# Patient Record
Sex: Female | Born: 1960 | Race: White | Hispanic: No | Marital: Married | Smoking: Current every day smoker
Health system: Southern US, Community
[De-identification: ages and names within clinical notes are randomized; demographics above are authoritative.]

## PROBLEM LIST (undated history)

## (undated) DIAGNOSIS — E785 Hyperlipidemia, unspecified: Secondary | ICD-10-CM

## (undated) DIAGNOSIS — Z789 Other specified health status: Secondary | ICD-10-CM

## (undated) DIAGNOSIS — J449 Chronic obstructive pulmonary disease, unspecified: Secondary | ICD-10-CM

## (undated) DIAGNOSIS — I2510 Atherosclerotic heart disease of native coronary artery without angina pectoris: Secondary | ICD-10-CM

## (undated) DIAGNOSIS — R0600 Dyspnea, unspecified: Secondary | ICD-10-CM

## (undated) DIAGNOSIS — R911 Solitary pulmonary nodule: Secondary | ICD-10-CM

## (undated) DIAGNOSIS — E782 Mixed hyperlipidemia: Secondary | ICD-10-CM

## (undated) DIAGNOSIS — J302 Other seasonal allergic rhinitis: Secondary | ICD-10-CM

## (undated) DIAGNOSIS — R0609 Other forms of dyspnea: Secondary | ICD-10-CM

## (undated) DIAGNOSIS — Z720 Tobacco use: Secondary | ICD-10-CM

## (undated) HISTORY — PX: ABDOMINAL HYSTERECTOMY: SHX81

## (undated) HISTORY — DX: Other specified health status: Z78.9

## (undated) HISTORY — PX: MULTIPLE TOOTH EXTRACTIONS: SHX2053

## (undated) HISTORY — DX: Tobacco use: Z72.0

## (undated) HISTORY — PX: HERNIA REPAIR: SHX51

## (undated) HISTORY — DX: Mixed hyperlipidemia: E78.2

## (undated) HISTORY — DX: Other forms of dyspnea: R06.09

## (undated) HISTORY — DX: Solitary pulmonary nodule: R91.1

## (undated) HISTORY — DX: Atherosclerotic heart disease of native coronary artery without angina pectoris: I25.10

---

## 1998-03-01 ENCOUNTER — Other Ambulatory Visit: Admission: RE | Admit: 1998-03-01 | Discharge: 1998-03-01 | Payer: Self-pay | Admitting: Obstetrics and Gynecology

## 1999-06-05 ENCOUNTER — Other Ambulatory Visit: Admission: RE | Admit: 1999-06-05 | Discharge: 1999-06-05 | Payer: Self-pay | Admitting: Obstetrics and Gynecology

## 2000-01-07 ENCOUNTER — Encounter: Payer: Self-pay | Admitting: Family Medicine

## 2000-01-07 ENCOUNTER — Ambulatory Visit (HOSPITAL_COMMUNITY): Admission: RE | Admit: 2000-01-07 | Discharge: 2000-01-07 | Payer: Self-pay | Admitting: Family Medicine

## 2001-03-06 ENCOUNTER — Ambulatory Visit (HOSPITAL_COMMUNITY): Admission: RE | Admit: 2001-03-06 | Discharge: 2001-03-06 | Payer: Self-pay | Admitting: Family Medicine

## 2001-03-06 ENCOUNTER — Encounter: Payer: Self-pay | Admitting: Family Medicine

## 2016-02-14 ENCOUNTER — Ambulatory Visit (INDEPENDENT_AMBULATORY_CARE_PROVIDER_SITE_OTHER): Payer: Managed Care, Other (non HMO) | Admitting: Osteopathic Medicine

## 2016-02-14 ENCOUNTER — Encounter: Payer: Self-pay | Admitting: Osteopathic Medicine

## 2016-02-14 VITALS — BP 130/67 | HR 82 | Resp 17 | Ht 63.25 in | Wt 140.0 lb

## 2016-02-14 DIAGNOSIS — K589 Irritable bowel syndrome without diarrhea: Secondary | ICD-10-CM | POA: Insufficient documentation

## 2016-02-14 DIAGNOSIS — K582 Mixed irritable bowel syndrome: Secondary | ICD-10-CM | POA: Diagnosis not present

## 2016-02-14 DIAGNOSIS — Z0000 Encounter for general adult medical examination without abnormal findings: Secondary | ICD-10-CM

## 2016-02-14 DIAGNOSIS — J302 Other seasonal allergic rhinitis: Secondary | ICD-10-CM

## 2016-02-14 DIAGNOSIS — N952 Postmenopausal atrophic vaginitis: Secondary | ICD-10-CM

## 2016-02-14 DIAGNOSIS — Z9289 Personal history of other medical treatment: Secondary | ICD-10-CM

## 2016-02-14 DIAGNOSIS — Z23 Encounter for immunization: Secondary | ICD-10-CM | POA: Diagnosis not present

## 2016-02-14 HISTORY — DX: Irritable bowel syndrome, unspecified: K58.9

## 2016-02-14 HISTORY — DX: Postmenopausal atrophic vaginitis: N95.2

## 2016-02-14 HISTORY — DX: Personal history of other medical treatment: Z92.89

## 2016-02-14 LAB — CBC WITH DIFFERENTIAL/PLATELET
Basophils Absolute: 0 cells/uL (ref 0–200)
Basophils Relative: 0 %
Eosinophils Absolute: 192 cells/uL (ref 15–500)
Eosinophils Relative: 3 %
HCT: 46.6 % — ABNORMAL HIGH (ref 35.0–45.0)
Hemoglobin: 15.2 g/dL (ref 11.7–15.5)
Lymphocytes Relative: 46 %
Lymphs Abs: 2944 cells/uL (ref 850–3900)
MCH: 30.1 pg (ref 27.0–33.0)
MCHC: 32.6 g/dL (ref 32.0–36.0)
MCV: 92.3 fL (ref 80.0–100.0)
MPV: 9.2 fL (ref 7.5–12.5)
Monocytes Absolute: 448 cells/uL (ref 200–950)
Monocytes Relative: 7 %
Neutro Abs: 2816 cells/uL (ref 1500–7800)
Neutrophils Relative %: 44 %
Platelets: 271 10*3/uL (ref 140–400)
RBC: 5.05 MIL/uL (ref 3.80–5.10)
RDW: 13.9 % (ref 11.0–15.0)
WBC: 6.4 10*3/uL (ref 3.8–10.8)

## 2016-02-14 LAB — HEPATITIS C ANTIBODY: HCV Ab: NEGATIVE

## 2016-02-14 LAB — TSH: TSH: 1.93 mIU/L

## 2016-02-14 MED ORDER — ESTRADIOL 0.1 MG/GM VA CREA: 1.0000 | TOPICAL_CREAM | Freq: Every day | VAGINAL | 12 refills | Status: DC

## 2016-02-14 NOTE — Progress Notes (Signed)
HPI: Sierra RomeHelga Snyder is a 56 y.o. female  who presents to [facility] [place] today, 02/14/16,  for chief complaint of:  Chief Complaint  Patient presents with  . Establish Care    Patient requests general physical exam  Hx chest discomfort: no exertional chest pain. Stress test was done around 2013 and was normal. Records requested. She states she gets similar discomfort every few months lasts a few seconds - minutes, no exertional chest pain  Seasonal allergies/Cough: coughing once in awhile. Dx with PNA at UC in [place] in the past but then told no pneumonia.   IBS: D+/C- alternating, occasional nausea in the mornings, nothing seems to help it.   Decreased libido: History of vaginal hysterectomy for fibroids, diminished libido over the past few years, no significant pain with sex but reports vaginal dryness, no bleeding. Went through menopause-type symptoms with hot flashes and make changes a few years ago but no longer having the symptoms.    Past medical, surgical, social and family history reviewed: There are no active problems to display for this patient.  No past surgical history on file. Social History  Substance Use Topics  . Smoking status: Not on file  . Smokeless tobacco: Not on file  . Alcohol use Not on file   No family history on file.   Current medication list and allergy/intolerance information reviewed:   Current Outpatient Prescriptions  Medication Sig Dispense Refill  . cetirizine (ZYRTEC) 10 MG tablet Take 10 mg by mouth daily as needed for allergies.    Marland Kitchen. gabapentin (NEURONTIN) 100 MG capsule Take 100 mg by mouth 3 (three) times daily as needed.     No current facility-administered medications for this visit.    Allergies  Allergen Reactions  . Allegra [Fexofenadine] Nausea Only  . Aspirin Other (See Comments)    Stomach pain  . Mucinex [Guaifenesin Er] Diarrhea  . Claritin [Loratadine] Rash  . Tamiflu [Oseltamivir  Phosphate] Rash      Review of Systems:  Constitutional:  No  fever, no chills, No recent illness, No unintentional weight changes. No significant fatigue.   HEENT: No  headache, no vision change, no hearing change, No sore throat, +sinus pressure  Cardiac: +prior chest pain, No  pressure, No palpitations, No  Orthopnea  Respiratory:  No  shortness of breath. No  Cough  Gastrointestinal: No  abdominal +, No  nausea, No  vomiting,  No  blood in stool, +diarrhea, +constipation   Musculoskeletal: No new myalgia/arthralgia  Genitourinary: No  incontinence, No  abnormal genital bleeding, No abnormal genital discharge  Skin: No  Rash, No other wounds/concerning lesions  Hem/Onc: No  easy bruising/bleeding, No  abnormal lymph node  Endocrine: No cold intolerance,  No heat intolerance. No polyuria/polydipsia/polyphagia   Neurologic: No  weakness, No  dizziness  Psychiatric: No  concerns with depression, No  concerns with anxiety, No sleep problems, No mood problems  Exam:  BP 130/67   Pulse 82   Resp 17   Ht 5' 3.25" (1.607 m)   Wt 140 lb (63.5 kg)   SpO2 98%   BMI 24.60 kg/m   Constitutional: VS see above. General Appearance: alert, well-developed, well-nourished, NAD  Eyes: Normal lids and conjunctive, non-icteric sclera  Ears, Nose, Mouth, Throat: MMM, Normal external inspection ears/nares/mouth/lips/gums. TM normal bilaterally. Pharynx/tonsils no erythema, no exudate. Nasal mucosa normal.   Neck: No masses, trachea midline. No thyroid enlargement. No tenderness/mass appreciated. No lymphadenopathy  Respiratory: Normal respiratory effort.  no wheeze, no rhonchi, no rales  Cardiovascular: S1/S2 normal, no murmur, no rub/gallop auscultated. RRR. No lower extremity edema.  Gastrointestinal: Nontender, no masses. No hepatomegaly, no splenomegaly. No hernia appreciated. Bowel sounds normal. Rectal exam deferred.   Musculoskeletal: Gait normal. No clubbing/cyanosis of  digits.   Neurological: Normal balance/coordination. No tremor. No cranial nerve deficit on limited exam. Motor and sensation intact and symmetric. Cerebellar reflexes intact.   Skin: warm, dry, intact. No rash/ulcer. No concerning nevi or subq nodules on limited exam.    Psychiatric: Normal judgment/insight. Normal mood and affect. Oriented x3.     ASSESSMENT/PLAN:   Annual physical exam - Plan: CBC with Differential/Platelet, Hepatitis C antibody, HIV antibody, Lipid panel, COMPLETE METABOLIC PANEL WITH GFR, TSH, VITAMIN D 25 Hydroxy (Vit-D Deficiency, Fractures), MM DIGITAL SCREENING BILATERAL, Tdap vaccine greater than or equal to 7yo IM  Irritable bowel syndrome with both constipation and diarrhea  Chronic seasonal allergic rhinitis due to other allergen  Postmenopausal atrophic vaginitis - trial topical estrogen  History of cardiovascular stress test - normal per patient. precautions reviewed   FEMALE PREVENTIVE CARE Updated 02/14/16   ANNUAL SCREENING/COUNSELING  Diet/Exercise - HEALTHY HABITS DISCUSSED TO DECREASE CV RISK History  Smoking Status  . Current Every Day Smoker  . Packs/day: 1.00  . Years: 25.00  . Types: Cigarettes  Smokeless Tobacco  . Never Used   History  Alcohol Use No   No flowsheet data found.  Domestic violence concerns - no  HTN SCREENING - SEE VITALS  SEXUAL HEALTH  Sexually active in the past year - Yes with female.  Need/want STI testing today? - no  Concerns about libido or pain with sex? - yes  Plans for pregnancy? - menopausal  INFECTIOUS DISEASE SCREENING  HIV - needs  GC/CT - does not need  HepC - DOB [birth year] - needs  TB - does not need  DISEASE SCREENING  Lipid - needs  DM2 - needs  Osteoporosis - women age 51+ - does not need  CANCER SCREENING  Cervical - does not need  Breast - needs  Lung - does not need  Colon - does not need  ADULT VACCINATION  Influenza - annual vaccine recommended  Td  - booster every 10 years   Zoster - option at 81, yes at 60+   PCV13 - was not indicated  PPSV23 - was not indicated Immunization History  Administered Date(s) Administered  . Tdap 02/14/2016   OTHER  Fall - exercise and Vit D age 51+ - does not need  Consider ASA - age 24-59 - does not need     Patient Instructions  Thank you for coming in today!  Plan:  Labs for routine screening, we will call you with results and follow-up recommendations  Mammogram due, you should get a call about scheduling this  Await records for colonoscopy report, stress test, other records  Start vaginal estrogen for dryness issues, if this medicine is not covered by your insurance, please give [place] a call  Let me know when you are running low on your prescription medications   Any questions or concerns let me know! Happy New Year!       Please note: Preventive care issues were addressed today per annual physical requirements and should be 100% covered under your insurance, however there were other medical issues which were also addressed and insurance may bill you separately for "problem-based visit." Any questions or concerns about charges which may appear under Bill should  be directed to your insurance company or to [facility] billing department, please contact our office with any other questions.     Visit summary with medication list and pertinent instructions was printed for patient to review. All questions at time of visit were answered - patient instructed to contact office with any additional concerns. ER/RTC precautions were reviewed with the patient. Follow-up plan: Return in about 1 year (around 02/13/2017) for ANNUAL PHYSICAL .

## 2016-02-14 NOTE — Patient Instructions (Signed)
Thank you for coming in today!  Plan:  Labs for routine screening, we will call you with results and follow-up recommendations  Mammogram due, you should get a call about scheduling this  Await records for colonoscopy report, stress test, other records  Start vaginal estrogen for dryness issues, if this medicine is not covered by your insurance, please give us a call  Let me know when you are running low on your prescription medications   Any questions or concerns let me know! Happy New Year!       Please note: Preventive care issues were addressed today per annual physical requirements and should be 100% covered under your insurance, however there were other medical issues which were also addressed and insurance may bill you separately for "problem-based visit." Any questions or concerns about charges which may appear under [employer] [employer] should be directed to your insurance company or to [facility] billing department, please contact our office with any other questions.

## 2016-02-15 LAB — COMPLETE METABOLIC PANEL WITH GFR
ALT: 23 U/L (ref 6–29)
AST: 20 U/L (ref 10–35)
Albumin: 4.4 g/dL (ref 3.6–5.1)
Alkaline Phosphatase: 104 U/L (ref 33–130)
BUN: 13 mg/dL (ref 7–25)
CO2: 28 mmol/L (ref 20–31)
Calcium: 9.7 mg/dL (ref 8.6–10.4)
Chloride: 104 mmol/L (ref 98–110)
Creat: 0.71 mg/dL (ref 0.50–1.05)
GFR, Est African American: >89 mL/min (ref >=60)
GFR, Est Non African American: >89 mL/min (ref >=60)
Glucose, Bld: 98 mg/dL (ref 65–99)
Potassium: 4.3 mmol/L (ref 3.5–5.3)
Sodium: 142 mmol/L (ref 135–146)
Total Bilirubin: 0.5 mg/dL (ref 0.2–1.2)
Total Protein: 6.8 g/dL (ref 6.1–8.1)

## 2016-02-15 LAB — LIPID PANEL
Cholesterol: 293 mg/dL — ABNORMAL HIGH (ref <200)
HDL: 50 mg/dL — ABNORMAL LOW (ref >50)
LDL Cholesterol: 202 mg/dL — ABNORMAL HIGH (ref <100)
Total CHOL/HDL Ratio: 5.9 Ratio — ABNORMAL HIGH (ref <5.0)
Triglycerides: 204 mg/dL — ABNORMAL HIGH (ref <150)
VLDL: 41 mg/dL — ABNORMAL HIGH (ref <30)

## 2016-02-15 LAB — VITAMIN D 25 HYDROXY (VIT D DEFICIENCY, FRACTURES): Vit D, 25-Hydroxy: 18 ng/mL — ABNORMAL LOW (ref 30–100)

## 2016-02-15 LAB — HIV ANTIBODY (ROUTINE TESTING W REFLEX): HIV 1&2 Ab, 4th Generation: NONREACTIVE

## 2016-04-15 ENCOUNTER — Encounter: Payer: Self-pay | Admitting: Osteopathic Medicine

## 2016-04-15 ENCOUNTER — Ambulatory Visit (INDEPENDENT_AMBULATORY_CARE_PROVIDER_SITE_OTHER): Payer: Managed Care, Other (non HMO)

## 2016-04-15 ENCOUNTER — Ambulatory Visit (INDEPENDENT_AMBULATORY_CARE_PROVIDER_SITE_OTHER): Payer: Managed Care, Other (non HMO) | Admitting: Osteopathic Medicine

## 2016-04-15 ENCOUNTER — Ambulatory Visit: Payer: Managed Care, Other (non HMO) | Admitting: Osteopathic Medicine

## 2016-04-15 VITALS — BP 127/68 | HR 90 | Ht 64.0 in | Wt 142.0 lb

## 2016-04-15 DIAGNOSIS — F17200 Nicotine dependence, unspecified, uncomplicated: Secondary | ICD-10-CM | POA: Diagnosis not present

## 2016-04-15 DIAGNOSIS — I700 Atherosclerosis of aorta: Secondary | ICD-10-CM

## 2016-04-15 DIAGNOSIS — J189 Pneumonia, unspecified organism: Secondary | ICD-10-CM | POA: Diagnosis not present

## 2016-04-15 DIAGNOSIS — Z8701 Personal history of pneumonia (recurrent): Secondary | ICD-10-CM | POA: Diagnosis not present

## 2016-04-15 DIAGNOSIS — R0602 Shortness of breath: Secondary | ICD-10-CM | POA: Diagnosis not present

## 2016-04-15 DIAGNOSIS — F329 Major depressive disorder, single episode, unspecified: Secondary | ICD-10-CM

## 2016-04-15 DIAGNOSIS — M5134 Other intervertebral disc degeneration, thoracic region: Secondary | ICD-10-CM | POA: Diagnosis not present

## 2016-04-15 DIAGNOSIS — M549 Dorsalgia, unspecified: Secondary | ICD-10-CM

## 2016-04-15 HISTORY — DX: Dorsalgia, unspecified: M54.9

## 2016-04-15 HISTORY — DX: Pneumonia, unspecified organism: J18.9

## 2016-04-15 HISTORY — DX: Major depressive disorder, single episode, unspecified: F32.9

## 2016-04-15 LAB — POCT URINALYSIS DIPSTICK
Bilirubin, UA: NEGATIVE
Blood, UA: NEGATIVE
Glucose, UA: NEGATIVE
Ketones, UA: NEGATIVE
LEUKOCYTES UA: NEGATIVE
Nitrite, UA: NEGATIVE
PROTEIN UA: NEGATIVE
Spec Grav, UA: 1.02
Urobilinogen, UA: 0.2
pH, UA: 7

## 2016-04-15 MED ORDER — DICLOFENAC SODIUM 75 MG PO TBEC: 75.0000 mg | DELAYED_RELEASE_TABLET | Freq: Two times a day (BID) | ORAL | 1 refills | Status: DC

## 2016-04-15 MED ORDER — CYCLOBENZAPRINE HCL 10 MG PO TABS: 5.0000 mg | ORAL_TABLET | Freq: Three times a day (TID) | ORAL | 0 refills | Status: DC | PRN

## 2016-04-15 NOTE — Progress Notes (Signed)
HPI: Sierra RomeHelga Snyder is a 56 y.o. female  who presents to [facility] [place] today, 04/15/16,  for chief complaint of:  Chief Complaint  Patient presents with  . Back Pain    Back Pain . Context: no injury but lifts at work. Hx broken vertebrae when younger, not sure which. Recent tx PNA on R, no records available.  . Location: between shoulder blades . Quality: aching, tight muscles . Duration: few weeks . Timing: worse with lifting and straining at work  . Modifying factors: ibuprofen 800 didn't help . Assoc signs/symptoms: bump on L shoulder that now appears bruised  (+) Depression screening - "Some things happened a fwe years ago I just cannot seem to get over" - husband cheating on her, she had to give away her 4 horses and 3 of them she later found out were starved, she blames herself. Is not interested in counseling or medications right now.     Past medical history, surgical history, social history and family history reviewed.  Patient Active Problem List   Diagnosis Date Noted  . IBS (irritable bowel syndrome) 02/14/2016  . Seasonal allergies 02/14/2016  . Postmenopausal atrophic vaginitis 02/14/2016  . History of cardiovascular stress test 02/14/2016    Current medication list and allergy/intolerance information reviewed.   Current Outpatient Prescriptions on File Prior to Visit  Medication Sig Dispense Refill  . cetirizine (ZYRTEC) 10 MG tablet Take 10 mg by mouth daily as needed for allergies.    Marland Kitchen. estradiol (ESTRACE) 0.1 MG/GM vaginal cream Place 1 Applicatorful vaginally at bedtime. Daily for 1 - 2 weeks, then use twice per week 42.5 g 12  . gabapentin (NEURONTIN) 100 MG capsule Take 100 mg by mouth 3 (three) times daily as needed.     No current facility-administered medications on file prior to visit.    Allergies  Allergen Reactions  . Allegra [Fexofenadine] Nausea Only  . Aspirin Other (See Comments)    Stomach pain  . Mucinex  [Guaifenesin Er] Diarrhea  . Claritin [Loratadine] Rash  . Tamiflu [Oseltamivir Phosphate] Rash      Review of Systems:  Constitutional: No recent illness  HEENT: No  headache, no vision change  Cardiac: No  chest pain, No  pressure, No palpitations  Respiratory:  No  shortness of breath. +Cough  Gastrointestinal: No  abdominal pain, no change on bowel habits  Musculoskeletal: +new myalgia/arthralgia  Skin: No  Rash  Hem/Onc: No  easy bruising/bleeding, No  abnormal lumps/bumps  Neurologic: No  weakness, No  Dizziness  Psychiatric: +screening for depression, No  concerns with anxiety  Exam:  BP 127/68   Pulse 90   Ht 5\' 4"  (1.626 m)   Wt 142 lb (64.4 kg)   BMI 24.37 kg/m   Constitutional: VS see above. General Appearance: alert, well-developed, well-nourished, NAD  Eyes: Normal lids and conjunctive, non-icteric sclera  Ears, Nose, Mouth, Throat: MMM, Normal external inspection ears/nares/mouth/lips/gums.  Neck: No masses, trachea midline.   Respiratory: Normal respiratory effort. (+) expiratory wheeze on R middle lung, no rhonchi, no rales  Cardiovascular: S1/S2 normal, no murmur, no rub/gallop auscultated. RRR.   Musculoskeletal: Gait normal. Symmetric and independent movement of all extremities. (+) muscle tightness rhomboids bilaterally, no midline spine tenderness  Neurological: Normal balance/coordination. No tremor.  Skin: warm, dry, intact.   Psychiatric: Normal judgment/insight. Normal mood and affect. Oriented x3. No SI/HI, occasional passive death thoughts     Depression screen [facility] 04/15/2016  Decreased Interest 2  Down, Depressed, Hopeless 2  PHQ - 2 Score 4  Altered sleeping 3  Tired, decreased energy 3  Change in appetite 2  Feeling bad or failure about yourself  2  Trouble concentrating 2  Moving slowly or fidgety/restless 0  Suicidal thoughts 1  PHQ-9 Score 17      ASSESSMENT/PLAN:   Other acute back pain - Plan: POCT  Urinalysis Dipstick, DG Thoracic Spine 2 View  Community acquired pneumonia of right lung, unspecified part of lung - Plan: DG Chest 2 View  Reactive depression    Patient Instructions  Plan:  Antiinflammatories and muscle relaxers for the next 1-2 weeks  Physical therapy if back pain no better  Xrays today  If breathing or back pain no better in 1-2 weeks, please make an appointment with [place] for lung function test     Follow-up plan: Return in about 2 weeks (around 04/29/2016).  Visit summary with medication list and pertinent instructions was printed for patient to review, alert [place] if any changes needed. All questions at time of visit were answered - patient instructed to contact office with any additional concerns. ER/RTC precautions were reviewed with the patient and understanding verbalized.

## 2016-04-15 NOTE — Patient Instructions (Signed)
Plan:  Antiinflammatories and muscle relaxers for the next 1-2 weeks  Physical therapy if back pain no better  Xrays today  If breathing or back pain no better in 1-2 weeks, please make an appointment with us for lung function test

## 2016-04-24 ENCOUNTER — Ambulatory Visit (INDEPENDENT_AMBULATORY_CARE_PROVIDER_SITE_OTHER): Payer: Managed Care, Other (non HMO) | Admitting: Osteopathic Medicine

## 2016-04-24 VITALS — BP 110/53 | HR 84 | Ht 64.0 in | Wt 142.0 lb

## 2016-04-24 DIAGNOSIS — J449 Chronic obstructive pulmonary disease, unspecified: Secondary | ICD-10-CM | POA: Insufficient documentation

## 2016-04-24 DIAGNOSIS — R0602 Shortness of breath: Secondary | ICD-10-CM | POA: Diagnosis not present

## 2016-04-24 HISTORY — DX: Chronic obstructive pulmonary disease, unspecified: J44.9

## 2016-04-24 MED ORDER — ALBUTEROL SULFATE (2.5 MG/3ML) 0.083% IN NEBU
2.5000 mg | INHALATION_SOLUTION | Freq: Once | RESPIRATORY_TRACT | Status: AC
  Administered 2016-04-24: 2.5 mg via RESPIRATORY_TRACT

## 2016-04-24 MED ORDER — UMECLIDINIUM-VILANTEROL 62.5-25 MCG/INH IN AEPB: 1.0000 | INHALATION_SPRAY | Freq: Every day | RESPIRATORY_TRACT | 11 refills | Status: DC

## 2016-04-24 NOTE — Progress Notes (Signed)
HPI: Sierra Snyder is a 56 y.o. female  who [facility] [place] today, 04/24/16,  for chief complaint of:  Chief Complaint  Patient presents with  . Shortness of Breath    No significant difficulty breathing, patient is able to perform all activities of daily living independently without significant SOB but struggles with strenuous activities . Quality: SOB on exertion and chronic cough . Duration: years . Context: hx smoking . Modifying factors: rest makes better, exertion makes worse . Assoc signs/symptoms: SOB, mild dry cough, recent tx pneumonia   Past medical history, surgical history, social history and family history reviewed.  Patient Active Problem List   Diagnosis Date Noted  . 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/15/2016  . Community acquired pneumonia of right lung 04/15/2016  . Notalgia 04/15/2016  . IBS (irritable bowel syndrome) 02/14/2016  . Seasonal allergies 02/14/2016  . Postmenopausal atrophic vaginitis 02/14/2016  . History of cardiovascular stress test 02/14/2016    Current medication list and allergy/intolerance information reviewed.   Current Outpatient Prescriptions on File Prior to Visit  Medication Sig Dispense Refill  . cetirizine (ZYRTEC) 10 MG tablet Take 10 mg by mouth daily as needed for allergies.    Marland Kitchen. estradiol (ESTRACE) 0.1 MG/GM vaginal cream Place 1 Applicatorful vaginally at bedtime. Daily for 1 - 2 weeks, then use twice per week 42.5 g 12  . gabapentin (NEURONTIN) 100 MG capsule Take 100 mg by mouth 3 (three) times daily as needed.    . cyclobenzaprine (FLEXERIL) 10 MG tablet Take 0.5-1 tablets (5-10 mg total) by mouth 3 (three) times daily as needed for muscle spasms. (Patient not taking: Reported on 04/24/2016) 30 tablet 0  . diclofenac (VOLTAREN) 75 MG EC tablet Take 1 tablet (75 mg total) by mouth 2 (two) times daily. (Patient not taking: Reported on 04/24/2016) 60 tablet 1   No current facility-administered  medications on file prior to visit.    Allergies  Allergen Reactions  . Allegra [Fexofenadine] Nausea Only  . Aspirin Other (See Comments)    Stomach pain  . Mucinex [Guaifenesin Er] Diarrhea  . Claritin [Loratadine] Rash  . Tamiflu [Oseltamivir Phosphate] Rash      Review of Systems:  Constitutional: Feels well today   Cardiac: No  chest pain, No  pressure, No palpitations  Respiratory:  No  shortness of breath. +occasional cough Cough  Neurologic: No  weakness, No  Dizziness   Exam:  BP (!) 110/53   Pulse 84   Ht 5\' 4"  (1.626 m)   Wt 142 lb (64.4 kg)   SpO2 98%   BMI 24.37 kg/m   Constitutional: VS see above. General Appearance: alert, well-developed, well-nourished, NAD  Neck: No masses, trachea midline.   Respiratory: Normal respiratory effort. no wheeze, no rhonchi, no rales  Neurological: Normal balance/coordination. No tremor.  Skin: warm, dry, intact.   Psychiatric: Normal judgment/insight. Normal mood and affect. Oriented x3.    PFT INTERPRETATION  FEV1/FVC >70 *AND*  FEV1 >80% predicted  = NORMAL SPIROMETRY NORMAL? no  FEV1/FVC: 47.7  >70 = Normal  <70 = Obstructive    = COPD yes Severity of Obstruction ~ Post-Bronchodilator FEV1:   FEV1 30-49 = GOLD Stage III = Severe Bronchodilator Challenge  Increase FEV1 or FVC by 200+mL? no *AND*  Increased same FEV1 or FVC by 12+%? no  Reversible? no     ASSESSMENT/PLAN:  Functionally mild disease based on symptoms though severe by spirometry. Plan to initiate inhaler therapy with a long-acting  beta agonist plus a long-acting anticholinergic. Ellipta device if covered, will do inhaler if not. Pt has decided to quit smoking! Discussion of COPD pathogenesis, natural history, and treatment, all questions answered.   SOB (shortness of breath) - Plan: albuterol (PROVENTIL) (2.5 MG/3ML) 0.083% nebulizer solution 2.5 mg, Spirometry: Pre & Post Eval  Stage 3 severe COPD by GOLD classification (HCC) -  Plan: umeclidinium-vilanterol (ANORO ELLIPTA) 62.5-25 MCG/INH AEPB    Follow-up plan: Return in about 6 weeks (around 06/05/2016) for recheck COPD. If doing well, continue current meds, If no better, will add ICS/consider pulm referral   Visit summary with medication list and pertinent instructions was printed for patient to review, alert [place] if any changes needed. All questions at time of visit were answered - patient instructed to contact office with any additional concerns. ER/RTC precautions were reviewed with the patient and understanding verbalized.

## 2016-04-24 NOTE — Patient Instructions (Signed)
Chronic Obstructive Pulmonary Disease Chronic obstructive pulmonary disease (COPD) is a common lung condition in which airflow from the lungs is limited. COPD is a general term that can be used to describe many different lung problems that limit airflow, including both chronic bronchitis and emphysema. If you have COPD, your lung function will probably never return to normal, but there are measures you can take to improve lung function and make yourself feel better. What are the causes?  Smoking (common).  Exposure to secondhand smoke.  Genetic problems.  Chronic inflammatory lung diseases or recurrent infections. What are the signs or symptoms?  Shortness of breath, especially with physical activity.  Deep, persistent (chronic) cough with a large amount of thick mucus.  Wheezing.  Rapid breaths (tachypnea).  Gray or bluish discoloration (cyanosis) of the skin, especially in your fingers, toes, or lips.  Fatigue.  Weight loss.  Frequent infections or episodes when breathing symptoms become much worse (exacerbations).  Chest tightness. How is this diagnosed? Your health care provider will take a medical history and perform a physical examination to diagnose COPD. Additional tests for COPD may include:  Lung (pulmonary) function tests.  Chest X-ray.  CT scan.  Blood tests. How is this treated? Treatment for COPD may include:  Inhaler and nebulizer medicines. These help manage the symptoms of COPD and make your breathing more comfortable.  Supplemental oxygen. Supplemental oxygen is only helpful if you have a low oxygen level in your blood.  Exercise and physical activity. These are beneficial for nearly all people with COPD.  Lung surgery or transplant.  Nutrition therapy to gain weight, if you are underweight.  Pulmonary rehabilitation. This may involve working with a team of health care providers and specialists, such as respiratory, occupational, and physical  therapists. Follow these instructions at home:  Take all medicines (inhaled or pills) as directed by your health care provider.  Avoid over-the-counter medicines or cough syrups that dry up your airway (such as antihistamines) and slow down the elimination of secretions unless instructed otherwise by your health care provider.  If you are a smoker, the most important thing that you can do is stop smoking. Continuing to smoke will cause further lung damage and breathing trouble. Ask your health care provider for help with quitting smoking. He or she can direct you to community resources or hospitals that provide support.  Avoid exposure to irritants such as smoke, chemicals, and fumes that aggravate your breathing.  Use oxygen therapy and pulmonary rehabilitation if directed by your health care provider. If you require home oxygen therapy, ask your health care provider whether you should purchase a pulse oximeter to measure your oxygen level at home.  Avoid contact with individuals who have a contagious illness.  Avoid extreme temperature and humidity changes.  Eat healthy foods. Eating smaller, more frequent meals and resting before meals may help you maintain your strength.  Stay active, but balance activity with periods of rest. Exercise and physical activity will help you maintain your ability to do things you want to do.  Preventing infection and hospitalization is very important when you have COPD. Make sure to receive all the vaccines your health care provider recommends, especially the pneumococcal and influenza vaccines. Ask your health care provider whether you need a pneumonia vaccine.  Learn and use relaxation techniques to manage stress.  Learn and use controlled breathing techniques as directed by your health care provider. Controlled breathing techniques include: 1. Pursed lip breathing. Start by breathing in (inhaling)   through your nose for 1 second. Then, purse your lips as  if you were going to whistle and breathe out (exhale) through the pursed lips for 2 seconds. 2. Diaphragmatic breathing. Start by putting one hand on your abdomen just above your waist. Inhale slowly through your nose. The hand on your abdomen should move out. Then purse your lips and exhale slowly. You should be able to feel the hand on your abdomen moving in as you exhale.  Learn and use controlled coughing to clear mucus from your lungs. Controlled coughing is a series of short, progressive coughs. The steps of controlled coughing are: 1. Lean your head slightly forward. 2. Breathe in deeply using diaphragmatic breathing. 3. Try to hold your breath for 3 seconds. 4. Keep your mouth slightly open while coughing twice. 5. Spit any mucus out into a tissue. 6. Rest and repeat the steps once or twice as needed. Contact a health care provider if:  You are coughing up more mucus than usual.  There is a change in the color or thickness of your mucus.  Your breathing is more labored than usual.  Your breathing is faster than usual. Get help right away if:  You have shortness of breath while you are resting.  You have shortness of breath that prevents you from:  Being able to talk.  Performing your usual physical activities.  You have chest pain lasting longer than 5 minutes.  Your skin color is more cyanotic than usual.  You measure low oxygen saturations for longer than 5 minutes with a pulse oximeter. This information is not intended to replace advice given to you by your health care provider. Make sure you discuss any questions you have with your health care provider. Document Released: 11/07/2004 Document Revised: 07/06/2015 Document Reviewed: 09/24/2012 [employer] Interactive Patient Education  2017 [employer]  

## 2016-04-29 ENCOUNTER — Other Ambulatory Visit: Payer: Managed Care, Other (non HMO)

## 2016-06-05 ENCOUNTER — Ambulatory Visit: Payer: Managed Care, Other (non HMO) | Admitting: Osteopathic Medicine

## 2016-07-02 ENCOUNTER — Encounter: Payer: Self-pay | Admitting: Osteopathic Medicine

## 2016-07-02 ENCOUNTER — Ambulatory Visit (INDEPENDENT_AMBULATORY_CARE_PROVIDER_SITE_OTHER): Payer: Managed Care, Other (non HMO) | Admitting: Osteopathic Medicine

## 2016-07-02 VITALS — BP 127/69 | HR 75 | Ht 64.0 in | Wt 147.0 lb

## 2016-07-02 DIAGNOSIS — J449 Chronic obstructive pulmonary disease, unspecified: Secondary | ICD-10-CM | POA: Diagnosis not present

## 2016-07-02 DIAGNOSIS — R1031 Right lower quadrant pain: Secondary | ICD-10-CM

## 2016-07-02 DIAGNOSIS — E785 Hyperlipidemia, unspecified: Secondary | ICD-10-CM | POA: Diagnosis not present

## 2016-07-02 NOTE — Progress Notes (Signed)
HPI: Sierra RomeHelga Snyder is a 56 y.o. female  who [facility] [place] [place] today, 07/02/16,  for chief complaint of:  Chief Complaint  Patient presents with  . Follow-up    COPD IS BETTER    COPD: Doing much better on inhaled medications. Patient is having less in the way of dyspnea on exertion type symptoms, overall breathing a good bit better.  Hyperlipidemia: Thinks she was fasting for labs this January. States cholesterol is often quite high LDL up in the 200s but then typically will go back down when she is good about lifestyle modification such as diet/exercise. Like to avoid medications for now if possible  Abdominal discomfort . Context: . Location: Right lower quadrant . Quality: Cramping type pain . Severity: . Duration: Several months on and off . Timing: Occasional pain with walking, doesn't tend to bother her otherwise  Context: Ovaries still present, has had hysterectomy, hx IBS Past Surgical History:  Procedure Laterality Date  . ABDOMINAL HYSTERECTOMY    . HERNIA REPAIR     x 3    Past medical history, surgical history, social history and family history reviewed.  Patient Active Problem List   Diagnosis Date Noted  . Stage 3 severe COPD by GOLD classification (HCC) 04/24/2016  . 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/15/2016  . Community acquired pneumonia of right lung 04/15/2016  . Notalgia 04/15/2016  . IBS (irritable bowel syndrome) 02/14/2016  . Seasonal allergies 02/14/2016  . Postmenopausal atrophic vaginitis 02/14/2016  . History of cardiovascular stress test 02/14/2016    Current medication list and allergy/intolerance information reviewed.   Current Outpatient Prescriptions on File Prior to Visit  Medication Sig Dispense Refill  . cetirizine (ZYRTEC) 10 MG tablet Take 10 mg by mouth daily as needed for allergies.    . cyclobenzaprine (FLEXERIL) 10 MG tablet Take 0.5-1 tablets (5-10 mg total) by mouth 3 (three) times daily as needed  for muscle spasms. (Patient not taking: Reported on 04/24/2016) 30 tablet 0  . diclofenac (VOLTAREN) 75 MG EC tablet Take 1 tablet (75 mg total) by mouth 2 (two) times daily. (Patient not taking: Reported on 04/24/2016) 60 tablet 1  . estradiol (ESTRACE) 0.1 MG/GM vaginal cream Place 1 Applicatorful vaginally at bedtime. Daily for 1 - 2 weeks, then use twice per week 42.5 g 12  . gabapentin (NEURONTIN) 100 MG capsule Take 100 mg by mouth 3 (three) times daily as needed.    . umeclidinium-vilanterol (ANORO ELLIPTA) 62.5-25 MCG/INH AEPB Inhale 1 puff into the lungs daily. 14 each 11   No current facility-administered medications on file prior to visit.    Allergies  Allergen Reactions  . Allegra [Fexofenadine] Nausea Only  . Aspirin Other (See Comments)    Stomach pain  . Mucinex [Guaifenesin Er] Diarrhea  . Claritin [Loratadine] Rash  . Tamiflu [Oseltamivir Phosphate] Rash      Review of Systems:  Constitutional: No recent illness, Feels well today  Cardiac: No  chest pain, No  pressure, No palpitations  Respiratory:  No  shortness of breath. No  Cough  Gastrointestinal: +abdominal pain, no change on bowel habits, no nausea, vomiting  Musculoskeletal: No new myalgia/arthralgia  Exam:  BP 127/69   Pulse 75   Ht 5\' 4"  (1.626 m)   Wt 147 lb (66.7 kg)   BMI 25.23 kg/m   Constitutional: VS see above. General Appearance: alert, well-developed, well-nourished, NAD  Eyes: Normal lids and conjunctive, non-icteric sclera  Ears, Nose, Mouth, Throat: MMM, Normal external  inspection ears/nares/mouth/lips/gums.  Neck: No masses, trachea midline.   Respiratory: Normal respiratory effort. no wheeze, no rhonchi, no rales. Diminished but clear breath sounds bilaterally  Cardiovascular: S1/S2 normal, no murmur, no rub/gallop auscultated. RRR.   Musculoskeletal: Gait normal. Symmetric and independent movement of all extremities. Normal range of motion hip and knee on right, negative  FABER, negative obturator, negative straight leg raise  Abdominal: Nontender, nondistended, bowel sounds within normal limits 4. No palpable mass in area of discomfort  Neurological: Normal balance/coordination. No tremor.  Skin: warm, dry, intact.   Psychiatric: Normal judgment/insight. Normal mood and affect. Oriented x3.      ASSESSMENT/PLAN:   Stage 3 severe COPD by GOLD classification (HCC) - Getting medications with coupon, doing well on the inhaled medicines. Plan to continue this indefinitely  Hyperlipidemia, unspecified hyperlipidemia type - Recheck when she comes back in for her mammogram - Plan: Lipid panel  Abdominal pain, RLQ (right lower quadrant) - Given intermittent nature, usually with walking, suspect musculoskeletal though possible ovarian/postsurgical. Consider imaging if no improvement     Follow-up plan: Return for mammogram and labs, annual physical due 02/2017, see me sooner if needed! .  Visit summary with medication list and pertinent instructions was printed for patient to review, alert [place] if any changes needed. All questions at time of visit were answered - patient instructed to contact office with any additional concerns. ER/RTC precautions were reviewed with the patient and understanding verbalized.

## 2016-09-23 ENCOUNTER — Ambulatory Visit (INDEPENDENT_AMBULATORY_CARE_PROVIDER_SITE_OTHER): Payer: Managed Care, Other (non HMO) | Admitting: Sports Medicine

## 2016-09-23 ENCOUNTER — Encounter: Payer: Self-pay | Admitting: Sports Medicine

## 2016-09-23 DIAGNOSIS — R590 Localized enlarged lymph nodes: Secondary | ICD-10-CM

## 2016-09-23 HISTORY — DX: Localized enlarged lymph nodes: R59.0

## 2016-09-23 MED ORDER — AZITHROMYCIN 250 MG PO TABS: ORAL_TABLET | ORAL | 0 refills | Status: DC

## 2016-09-23 MED ORDER — FLUTICASONE PROPIONATE 50 MCG/ACT NA SUSP: NASAL | 3 refills | Status: DC

## 2016-09-23 NOTE — Assessment & Plan Note (Signed)
Right-sided, somewhat painful, also with component of eustachian tube dysfunction. Azithromycin,intranasal fluticasone. Return to see me in 2 weeks, we will consider sinus CT if no better.  No clinical evidence of mastoiditis, she does have a tender posterior auricular lymph node on the right. She is post multiple teeth extraction in the distant past.

## 2016-09-23 NOTE — Progress Notes (Signed)
  Subjective:    CC:  Right ear pain  HPI: This is a pleasant 56 year old female, she has a history of a multiple tooth extraction for recurrent sinusitis, for the past couple of weeks she's had pain in her right ear as well as behind the right ear, she tells me she's noted a lump behind her right ear as well. Symptoms are moderate, persistent.No pain at the TMJ, no constitutional symptoms.  Past medical history:  Negative.  See flowsheet/record as well for more information.  Surgical history: Negative.  See flowsheet/record as well for more information.  Family history: Negative.  See flowsheet/record as well for more information.  Social history: Negative.  See flowsheet/record as well for more information.  Allergies, and medications have been entered into the medical record, reviewed, and no changes needed.   Review of Systems: No fevers, chills, night sweats, weight loss, chest pain, or shortness of breath.   Objective:    General: Well Developed, well nourished, and in no acute distress.  Neuro: Alert and oriented x3, extra-ocular muscles intact, sensation grossly intact.  HEENT: Normocephalic, atraumatic, pupils equal round reactive to light, neck supple, no masses, thyroid nonpalpable.  Oropharynx, nasopharynx, ear canals unremarkable, there is a 1.5 cm palpable, well-defined movable tender right posterior auricular lymph node. No tenderness over the temporomandibular joint although she does have a mild palpable click. Skin: Warm and dry, no rashes. Cardiac: Regular rate and rhythm, no murmurs rubs or gallops, no lower extremity edema.  Respiratory: Clear to auscultation bilaterally. Not using accessory muscles, speaking in full sentences.  Impression and Recommendations:    Posterior auricular lymphadenopathy Right-sided, somewhat painful, also with component of eustachian tube dysfunction. Azithromycin,intranasal fluticasone. Return to see me in 2 weeks, we will consider sinus  CT if no better.  No clinical evidence of mastoiditis, she does have a tender posterior auricular lymph node on the right. She is post multiple teeth extraction in the distant past.  I spent 25 minutes with this patient, greater than 50% was face-to-face time counseling regarding the above diagnoses

## 2016-10-08 ENCOUNTER — Encounter: Payer: Self-pay | Admitting: Sports Medicine

## 2016-10-08 ENCOUNTER — Ambulatory Visit (INDEPENDENT_AMBULATORY_CARE_PROVIDER_SITE_OTHER): Payer: Managed Care, Other (non HMO) | Admitting: Sports Medicine

## 2016-10-08 DIAGNOSIS — R590 Localized enlarged lymph nodes: Secondary | ICD-10-CM | POA: Diagnosis not present

## 2016-10-08 NOTE — Progress Notes (Signed)
  Subjective:    CC: Follow-up  HPI: This is a pleasant 56 year old female, we treated her 2 weeks ago for right postauricular lymphadenopathy, she finished her azithromycin and symptoms resolved. She still has only a touch of discomfort in her right ear but heading in the right direction.  Past medical history:  Negative.  See flowsheet/record as well for more information.  Surgical history: Negative.  See flowsheet/record as well for more information.  Family history: Negative.  See flowsheet/record as well for more information.  Social history: Negative.  See flowsheet/record as well for more information.  Allergies, and medications have been entered into the medical record, reviewed, and no changes needed.   Review of Systems: No fevers, chills, night sweats, weight loss, chest pain, or shortness of breath.   Objective:    General: Well Developed, well nourished, and in no acute distress.  Neuro: Alert and oriented x3, extra-ocular muscles intact, sensation grossly intact.  HEENT: Normocephalic, atraumatic, pupils equal round reactive to light, neck supple, no masses, no lymphadenopathy, thyroid nonpalpable. Oropharynx, ear canals unremarkable. Skin: Warm and dry, no rashes. Cardiac: Regular rate and rhythm, no murmurs rubs or gallops, no lower extremity edema.  Respiratory: Clear to auscultation bilaterally. Not using accessory muscles, speaking in full sentences.  Impression and Recommendations:    Posterior auricular lymphadenopathy Resolved, return as needed.  Had a bit of discomfort still, that we'll likely continue to improve over time.  NSAIDs as needed.

## 2016-10-08 NOTE — Assessment & Plan Note (Signed)
Resolved, return as needed.  Had a bit of discomfort still, that we'll likely continue to improve over time.  NSAIDs as needed.

## 2017-07-04 ENCOUNTER — Encounter: Payer: Self-pay | Admitting: Osteopathic Medicine

## 2017-07-04 ENCOUNTER — Ambulatory Visit (INDEPENDENT_AMBULATORY_CARE_PROVIDER_SITE_OTHER): Payer: Managed Care, Other (non HMO) | Admitting: Osteopathic Medicine

## 2017-07-04 VITALS — BP 117/69 | HR 84 | Temp 98.2°F | Wt 133.1 lb

## 2017-07-04 DIAGNOSIS — M25561 Pain in right knee: Secondary | ICD-10-CM | POA: Diagnosis not present

## 2017-07-04 DIAGNOSIS — F17210 Nicotine dependence, cigarettes, uncomplicated: Secondary | ICD-10-CM

## 2017-07-04 DIAGNOSIS — R5382 Chronic fatigue, unspecified: Secondary | ICD-10-CM

## 2017-07-04 DIAGNOSIS — Z2821 Immunization not carried out because of patient refusal: Secondary | ICD-10-CM

## 2017-07-04 DIAGNOSIS — Z0000 Encounter for general adult medical examination without abnormal findings: Secondary | ICD-10-CM

## 2017-07-04 DIAGNOSIS — J449 Chronic obstructive pulmonary disease, unspecified: Secondary | ICD-10-CM

## 2017-07-04 DIAGNOSIS — E785 Hyperlipidemia, unspecified: Secondary | ICD-10-CM | POA: Diagnosis not present

## 2017-07-04 DIAGNOSIS — R599 Enlarged lymph nodes, unspecified: Secondary | ICD-10-CM

## 2017-07-04 DIAGNOSIS — R079 Chest pain, unspecified: Secondary | ICD-10-CM | POA: Diagnosis not present

## 2017-07-04 MED ORDER — TIOTROPIUM BROMIDE-OLODATEROL 2.5-2.5 MCG/ACT IN AERS: 1.0000 | INHALATION_SPRAY | Freq: Every day | RESPIRATORY_TRACT | 11 refills | Status: DC

## 2017-07-04 NOTE — Progress Notes (Signed)
HPI: Sierra Snyder is a 57 y.o. female who  has no past medical history on file.  she presents to [facility] today, 07/04/17,  for chief complaint of: Annual physical    Patient here for annual physical / wellness exam.  See preventive care reviewed as below.  Recent labs reviewed in detail with the patient.   Additional concerns today include:   Ear pain on L side in front of ear, treated for R similar 09/2016 - Dr T saw her for posterior auricular lymphadenopathy which resolved on azithro, today no ear pain she just feels a small lump in front of the left ear, no jaw pain or difficulty chewing, no dental problems or hearing loss   Shooting pain in L side of upper chest, comes and goes, sometimes few times per day, sometimes goes a week without it but usually happens at work. Feels sharp, she presses on it then it goes away. No SOB, no CP on exertion   Fatigue, sometimes so bad she can't drive all the way to or from work without having to stop to sleep. No apnea, some snoring.   COPD, no longer on the Ellipta due to cost, breathing okay , no coughing   Knee pain from injury a few weeks ago, getting better but not gone    Past medical, surgical, social and family history reviewed:  Patient Active Problem List   Diagnosis Date Noted  . 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  . Stage 3 severe COPD by GOLD classification (HCC) 04/24/2016  . 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/15/2016  . Community acquired pneumonia of right lung 04/15/2016  . Notalgia 04/15/2016  . IBS (irritable bowel syndrome) 02/14/2016  . Seasonal allergies 02/14/2016  . Postmenopausal atrophic vaginitis 02/14/2016  . History of cardiovascular stress test 02/14/2016    Past Surgical History:  Procedure Laterality Date  . ABDOMINAL HYSTERECTOMY    . HERNIA REPAIR     x 3    Social History   Tobacco Use  . Smoking status: Current Every Day Smoker    Packs/day: 0.50     Years: 25.00    Pack years: 12.50    Types: Cigarettes  . Smokeless tobacco: Never Used  . Tobacco comment: As per pt, smokes 10 cigs daily  Substance Use Topics  . Alcohol use: No    Family History  Problem Relation Age of Onset  . Stroke Mother 81  . Diabetes Father      Current medication list and allergy/intolerance information reviewed:    Current Outpatient Medications  Medication Sig Dispense Refill  . cetirizine (ZYRTEC) 10 MG tablet Take 10 mg by mouth daily as needed for allergies.    Marland Kitchen gabapentin (NEURONTIN) 100 MG capsule Take 100 mg by mouth 3 (three) times daily as needed.    . fluticasone (FLONASE) 50 MCG/ACT nasal spray One spray in each nostril twice a day, use left hand for right nostril, and right hand for left nostril. (Patient not taking: Reported on 07/04/2017) 48 g 3  . umeclidinium-vilanterol (ANORO ELLIPTA) 62.5-25 MCG/INH AEPB Inhale 1 puff into the lungs daily. (Patient not taking: Reported on 07/04/2017) 14 each 11   No current facility-administered medications for this visit.     Allergies  Allergen Reactions  . Allegra [Fexofenadine] Nausea Only  . Aspirin Other (See Comments)    Stomach pain  . Mucinex [Guaifenesin Er] Diarrhea  . Claritin [Loratadine] Rash  . Tamiflu [Oseltamivir Phosphate] Rash  Review of Systems:  Constitutional:  No  fever, no chills, No recent illness, No unintentional weight changes. No significant fatigue.   HEENT: No  headache, no vision change, no hearing change, No sore throat, No  sinus pressure  Cardiac: +chest pain, No  pressure, No palpitations, No  Orthopnea  Respiratory:  No  shortness of breath. +imld chronic Cough  Gastrointestinal: No  abdominal pain, No  nausea, No  vomiting,  No  blood in stool, No  diarrhea, No  constipation   Musculoskeletal: +new myalgia/arthralgia knee pain as per HPI  Skin: No  Rash, No other wounds/concerning lesions  Genitourinary: No  incontinence, No  abnormal genital  bleeding, No abnormal genital discharge  Hem/Onc: No  easy bruising/bleeding, No  abnormal lymph node  Endocrine: No cold intolerance,  No heat intolerance. No polyuria/polydipsia/polyphagia   Neurologic: No  weakness, No  dizziness  Psychiatric: No  concerns with depression, No  concerns with anxiety, No sleep problems, No mood problems  Exam:  BP 117/69 (BP Location: Left Arm, Patient Position: Sitting, Cuff Size: Normal)   Pulse 84   Temp 98.2 F (36.8 C) (Oral)   Wt 133 lb 1.6 oz (60.4 kg)   BMI 22.85 kg/m   Constitutional: VS see above. General Appearance: alert, well-developed, well-nourished, NAD  Eyes: Normal lids and conjunctive, non-icteric sclera  Ears, Nose, Mouth, Throat: MMM, Normal external inspection ears/nares/mouth/lips/gums. TM normal bilaterally. Pharynx/tonsils no erythema, no exudate. Nasal mucosa normal. Small palpable LN preauricular L side nontender  Neck: No masses, trachea midline. No thyroid enlargement. No tenderness/mass appreciated. No lymphadenopathy  Respiratory: Normal respiratory effort. no wheeze, no rhonchi, no rales  Cardiovascular: S1/S2 normal, no murmur, no rub/gallop auscultated. RRR. No lower extremity edema. No chest wall tenderness at this time   Gastrointestinal: Nontender, no masses. No hepatomegaly, no splenomegaly. No hernia appreciated. Bowel sounds normal. Rectal exam deferred.   Musculoskeletal: Gait normal. No clubbing/cyanosis of digits.   Neurological: Normal balance/coordination. No tremor. No cranial nerve deficit on limited exam.   Skin: warm, dry, intact.  Psychiatric: Normal judgment/insight. Normal mood and affect. Oriented x3.   EKG interpretation: Rate: 75  Rhythm: sinus No ST/T changes concerning for acute ischemia/infarct  Previous EKG none available    ASSESSMENT/PLAN:   Annual physical exam - see below for preventive care review  - Plan: Ambulatory referral to Gastroenterology, MM 3D SCREEN BREAST  BILATERAL, DG Bone Density  Stage 3 severe COPD by GOLD classification (HCC) - ellipta too expensive, will trial alternative - Plan: CBC, COMPLETE METABOLIC PANEL WITH GFR, Tiotropium Bromide-Olodaterol (STIOLTO RESPIMAT) 2.5-2.5 MCG/ACT AERS, Ambulatory Referral for Lung Cancer Scre  Hyperlipidemia, unspecified hyperlipidemia type - has declined statin, will recheck and wee what numbers are looking like - Plan: Lipid panel, High sensitivity CRP  Chronic fatigue - Strongly consider sleep study if all labs normal, also needs to getback on inhaler for COPD though she reports breathing overall ok  - Plan: CBC, COMPLETE METABOLIC PANEL WITH GFR, Lipid panel, TSH, Hemoglobin A1c, VITAMIN D 25 Hydroxy (Vit-D Deficiency, Fractures), High sensitivity CRP  Palpable lymph node - nothing to worry about but if worse or other symtpoms RTC  Chest pain, unspecified type - seems MSK, normal exam today, normal EKG, risk factors so will get exercise stress test routine, ER precautions reviewed  - Plan: Exercise Tolerance Test, EKG 12-Lead  Smoking greater than 30 pack years - offered Pneumovax, declined - Plan: Ambulatory Referral for Lung Cancer Scre, DG Bone Density  Recurrent pain of right knee - f/u sports med, no time to discuss this today, gait normal and no effusion   23-polyvalent pneumococcal polysaccharide vaccine declined   FEMALE PREVENTIVE CARE Updated 07/04/17   ANNUAL SCREENING/COUNSELING  Diet/Exercise - HEALTHY HABITS DISCUSSED TO DECREASE CV RISK Social History   Tobacco Use  Smoking Status Current Every Day Smoker  . Packs/day: 1.00  . Years: 36.00  . Pack years: 36.00  . Types: Cigarettes  Smokeless Tobacco Never Used   Social History   Substance and Sexual Activity  Alcohol Use No    Depression screen PHQ 2/9 07/04/2017  Decreased Interest 1  Down, Depressed, Hopeless 0  PHQ - 2 Score 1  Altered sleeping 3  Tired, decreased energy 3  Change in appetite 3  Feeling bad  or failure about yourself  0  Trouble concentrating 1  Moving slowly or fidgety/restless 0  Suicidal thoughts 0  PHQ-9 Score 11  Difficult doing work/chores Somewhat difficult     Domestic violence concerns - no  HTN SCREENING - SEE VITALS  SEXUAL HEALTH  Sexually active in the past year - Yes with female.  Need/want STI testing today? - no  Concerns about libido or pain with sex? - no  Plans for pregnancy? - n/a  INFECTIOUS DISEASE SCREENING  HIV - does not need  GC/CT - does not need  HepC - DOB [birth year] - does not need  TB - does not need  DISEASE SCREENING  Lipid - needs  DM2 - needs  Osteoporosis - women age 34+ - needs  Fracture after age 56? no  Chronic steroid use? no  Smoking/Alcohol? yes smoker  Low body weight <127lb? no  Hip fracture in parent? no  Premature menopause, malabsorbtion, CLD, IBD, RA? no  CANCER SCREENING  Cervical - does not need s/p hysterectomy  Breast - needs  Lung - needs  Colon - needs, referred for colonoscopy   ADULT VACCINATION  Influenza - annual vaccine recommended  Td - booster every 10 years   Zoster - Shingrix recommended 50+  PCV13 - was not indicated  PPSV23 - was given Immunization History  Administered Date(s) Administered  . Tdap 02/14/2016    OTHER  Fall - exercise and Vit D age 34+ - does not need    Patient Instructions  Please note: Preventive care issues were addressed today as part of your annual wellness physical, and this care should be covered under your insurance. However there were other medical issues which were also addressed today, and insurance may bill you separately for a "problem-based visit" for this care: COPD, Chest Pain, Ear Pain, Fatigue, were also pertinent to this visit. Any questions or concerns about charges which may appear on your billing statement should be directed to your insurance company or to [facility] billing department, and they will contact our  office if there are further concerns.    In the future if you have medical questions or concerns, please schedule two separate visits for annual preventive care and for addressing other medical issues    Labs today for routine check and evaluate fatigue  EKG today for chest pain, and we should schedule a stress test as well to rule out heart issues  I think the lump in front of ear is just a small lymph node and nothing to worry about  Please schedule a visit with Dr. Karie Schwalbe to check the knee if not better  Different inhaler for COPD management  You should get call to set up:  Lung cancer screening with CT scan   Breast cancer screening with Mammogram  Colon cancer screening with Colonoscopy   Osteoporosis screening with Bone Density test     Visit summary with medication list and pertinent instructions was printed for patient to review. All questions at time of visit were answered - patient instructed to contact office with any additional concerns. ER/RTC precautions were reviewed with the patient.   Follow-up plan: Return for recheck depending on results - otherwise check breathing in 3 months .  Note: Total time spent on problem-based portion of this visit, 40 minutes, greater than 50% of the visit was spent face-to-face counseling and coordinating care for the following: Stage 3 severe COPD by GOLD classification (HCC), Hyperlipidemia, unspecified hyperlipidemia type, Chronic fatigue, Palpable lymph node, Chest pain, unspecified type, Smoking greater than 30 pack years, Recurrent pain of right knee, and 23-polyvalent pneumococcal polysaccharide vaccine declined were also pertinent to this visit.Marland Kitchen  Please note: voice recognition software was used to produce this document, and typos may escape review. Please contact Dr. Lyn Hollingshead for any needed clarifications.

## 2017-07-04 NOTE — Patient Instructions (Addendum)
Please note: Preventive care issues were addressed today as part of your annual wellness physical, and this care should be covered under your insurance. However there were other medical issues which were also addressed today, and insurance may bill you separately for a "problem-based visit" for this care: COPD, Chest Pain, Ear Pain, Fatigue, were also pertinent to this visit. Any questions or concerns about charges which may appear on your billing statement should be directed to your insurance company or to [facility] billing department, and they will contact our office if there are further concerns.    In the future if you have medical questions or concerns, please schedule two separate visits for annual preventive care and for addressing other medical issues    Labs today for routine check and evaluate fatigue  EKG today for chest pain, and we should schedule a stress test as well to rule out heart issues  I think the lump in front of ear is just a small lymph node and nothing to worry about  Please schedule a visit with Dr. Karie Schwalbe to check the knee if not better  Different inhaler for COPD management    You should get call to set up:  Lung cancer screening with CT scan   Breast cancer screening with Mammogram  Colon cancer screening with Colonoscopy   Osteoporosis screening with Bone Density test

## 2017-07-05 LAB — COMPLETE METABOLIC PANEL WITHOUT GFR
AG Ratio: 2.3 (calc) (ref 1.0–2.5)
ALT: 18 U/L (ref 6–29)
AST: 16 U/L (ref 10–35)
Albumin: 4.8 g/dL (ref 3.6–5.1)
Alkaline phosphatase (APISO): 102 U/L (ref 33–130)
BUN: 16 mg/dL (ref 7–25)
CO2: 27 mmol/L (ref 20–32)
Calcium: 9.7 mg/dL (ref 8.6–10.4)
Chloride: 105 mmol/L (ref 98–110)
Creat: 0.7 mg/dL (ref 0.50–1.05)
GFR, Est African American: 113 mL/min/{1.73_m2}
GFR, Est Non African American: 98 mL/min/{1.73_m2}
Globulin: 2.1 g/dL (ref 1.9–3.7)
Glucose, Bld: 97 mg/dL (ref 65–99)
Potassium: 4.3 mmol/L (ref 3.5–5.3)
Sodium: 140 mmol/L (ref 135–146)
Total Bilirubin: 0.5 mg/dL (ref 0.2–1.2)
Total Protein: 6.9 g/dL (ref 6.1–8.1)

## 2017-07-05 LAB — CBC
HCT: 45 % (ref 35.0–45.0)
Hemoglobin: 15.4 g/dL (ref 11.7–15.5)
MCH: 30.4 pg (ref 27.0–33.0)
MCHC: 34.2 g/dL (ref 32.0–36.0)
MCV: 88.9 fL (ref 80.0–100.0)
MPV: 9.2 fL (ref 7.5–12.5)
Platelets: 276 10*3/uL (ref 140–400)
RBC: 5.06 Million/uL (ref 3.80–5.10)
RDW: 12.8 % (ref 11.0–15.0)
WBC: 7.2 10*3/uL (ref 3.8–10.8)

## 2017-07-05 LAB — LIPID PANEL
CHOL/HDL RATIO: 4.6 (calc) (ref <5.0)
CHOLESTEROL: 262 mg/dL — AB (ref <200)
HDL: 57 mg/dL (ref >50)
LDL CHOLESTEROL (CALC): 184 mg/dL — AB
Non-HDL Cholesterol (Calc): 205 mg/dL (calc) — ABNORMAL HIGH (ref <130)
Triglycerides: 91 mg/dL (ref <150)

## 2017-07-05 LAB — HEMOGLOBIN A1C
Hgb A1c MFr Bld: 5.6 % of total Hgb (ref <5.7)
Mean Plasma Glucose: 114 (calc)
eAG (mmol/L): 6.3 (calc)

## 2017-07-05 LAB — HIGH SENSITIVITY CRP: HS-CRP: 1.5 mg/L

## 2017-07-05 LAB — TSH: TSH: 1.28 mIU/L

## 2017-07-08 ENCOUNTER — Telehealth: Payer: Self-pay | Admitting: Acute Care

## 2017-07-08 ENCOUNTER — Other Ambulatory Visit: Payer: Self-pay

## 2017-07-08 DIAGNOSIS — Z122 Encounter for screening for malignant neoplasm of respiratory organs: Secondary | ICD-10-CM

## 2017-07-08 DIAGNOSIS — J449 Chronic obstructive pulmonary disease, unspecified: Secondary | ICD-10-CM

## 2017-07-08 DIAGNOSIS — F17210 Nicotine dependence, cigarettes, uncomplicated: Principal | ICD-10-CM

## 2017-07-08 MED ORDER — TIOTROPIUM BROMIDE-OLODATEROL 2.5-2.5 MCG/ACT IN AERS: 2.0000 | INHALATION_SPRAY | Freq: Every day | RESPIRATORY_TRACT | 11 refills | Status: DC

## 2017-07-10 NOTE — Telephone Encounter (Signed)
LMTC at home and mobile number

## 2017-07-15 MED ORDER — ATORVASTATIN CALCIUM 40 MG PO TABS: 40.0000 mg | ORAL_TABLET | Freq: Every day | ORAL | 3 refills | Status: DC

## 2017-07-15 NOTE — Addendum Note (Signed)
Addended by: Deirdre PippinsALEXANDER, Kaisen Ackers M on: 07/15/2017 09:25 AM   Modules accepted: Orders

## 2017-07-16 NOTE — Telephone Encounter (Signed)
Spoke with pt and scheduled SDMV 08/01/17 2:30 CT ordered Nothing further needed

## 2017-08-01 ENCOUNTER — Ambulatory Visit (INDEPENDENT_AMBULATORY_CARE_PROVIDER_SITE_OTHER)
Admission: RE | Admit: 2017-08-01 | Discharge: 2017-08-01 | Disposition: A | Discharge status: Home / Self Care | Payer: Managed Care, Other (non HMO) | Source: Ambulatory Visit | Attending: Acute Care | Admitting: Acute Care

## 2017-08-01 ENCOUNTER — Ambulatory Visit (INDEPENDENT_AMBULATORY_CARE_PROVIDER_SITE_OTHER): Payer: Managed Care, Other (non HMO) | Admitting: Acute Care

## 2017-08-01 ENCOUNTER — Encounter: Payer: Self-pay | Admitting: Acute Care

## 2017-08-01 DIAGNOSIS — F17210 Nicotine dependence, cigarettes, uncomplicated: Secondary | ICD-10-CM

## 2017-08-01 DIAGNOSIS — Z122 Encounter for screening for malignant neoplasm of respiratory organs: Secondary | ICD-10-CM

## 2017-08-01 NOTE — Progress Notes (Signed)
Shared Decision Making Visit Lung Cancer Screening Program [phone]([id number])   Eligibility:  Age 57 y.o.  Pack Years Smoking History Calculation 55 pack year smoking history (# packs/per year x # years smoked)  Recent History of coughing up blood  no  Unexplained weight loss? no ( >Than 15 pounds within the last 6 months )  Prior History Lung / other cancer no (Diagnosis within the last 5 years already requiring surveillance chest CT Scans).  Smoking Status Current Smoker  Former Smokers: Years since quit: NA  Quit Date: NA  Visit Components:  Discussion included one or more decision making aids. yes  Discussion included risk/benefits of screening. yes  Discussion included potential follow up diagnostic testing for abnormal scans. yes  Discussion included meaning and risk of over diagnosis. yes  Discussion included meaning and risk of False Positives. yes  Discussion included meaning of total radiation exposure. yes  Counseling Included:  Importance of adherence to annual lung cancer LDCT screening. yes  Impact of comorbidities on ability to participate in the program. yes  Ability and willingness to under diagnostic treatment. yes  Smoking Cessation Counseling:  Current Smokers:   Discussed importance of smoking cessation. yes  Information about tobacco cessation classes and interventions provided to patient. yes  Patient provided with "ticket" for LDCT Scan. yes  Symptomatic Patient. no  Counseling  Diagnosis Code: Tobacco Use Z72.0  Asymptomatic Patient yes  Counseling (Intermediate counseling: > three minutes counseling) [id number]  Former Smokers:   Discussed the importance of maintaining cigarette abstinence. yes  Diagnosis Code: Personal History of Nicotine Dependence. U98.119Z87.891  Information about tobacco cessation classes and interventions provided to patient. Yes  Patient provided with "ticket" for LDCT Scan. yes  Written Order for Lung Cancer  Screening with LDCT placed in Epic. Yes (CT Chest Lung Cancer Screening Low Dose W/O CM) [id number] Z12.2-Screening of respiratory organs Z87.891-Personal history of nicotine dependence   I have spent 25 minutes of face to face time with Sierra Snyder discussing the risks and benefits of lung cancer screening. We viewed a power point together that explained in detail the above noted topics. We paused at intervals to allow for questions to be asked and answered to ensure understanding.We discussed that the single most powerful action that she can take to decrease her risk of developing lung cancer is to quit smoking. We discussed whether or not she is ready to commit to setting a quit date. She is not ready to set a quit date. We discussed options for tools to aid in quitting smoking including nicotine replacement therapy, non-nicotine medications, support groups, Quit Smart classes, and behavior modification. We discussed that often times setting smaller, more achievable goals, such as eliminating 1 cigarette a day for a week and then 2 cigarettes a day for a week can be helpful in slowly decreasing the number of cigarettes smoked. This allows for a sense of accomplishment as well as providing a clinical benefit. I gave her the " Be Stronger Than Your Excuses" card with contact information for community resources, classes, free nicotine replacement therapy, and access to mobile apps, text messaging, and on-line smoking cessation help. I have also given her my card and contact information in the event she needs to contact me. We discussed the time and location of the scan, and that either Abigail Miyamotoenise Phelps RN or I will call with the results within 24-48 hours of receiving them. I have offered her  a copy of the power point we viewed  as a resource in the event they need reinforcement of the concepts we discussed today in the office. The patient verbalized understanding of all of  the above and had no further questions  upon leaving the office. They have my contact information in the event they have any further questions.  I spent 4 minutes counseling on smoking cessation and the health risks of continued tobacco abuse.  I explained to the patient that there has been a high incidence of coronary artery disease noted on these exams. I explained that this is a non-gated exam therefore degree or severity cannot be determined. This patient is currently on statin therapy. I have asked the patient to follow-up with their PCP regarding any incidental finding of coronary artery disease and management with diet or medication as their PCP  feels is clinically indicated. The patient verbalized understanding of the above and had no further questions upon completion of the visit.     Bevelyn Ngo, NP 08/01/2017 3:00 PM

## 2017-08-06 ENCOUNTER — Other Ambulatory Visit: Payer: Self-pay | Admitting: Acute Care

## 2017-08-06 DIAGNOSIS — F17210 Nicotine dependence, cigarettes, uncomplicated: Principal | ICD-10-CM

## 2017-08-06 DIAGNOSIS — Z122 Encounter for screening for malignant neoplasm of respiratory organs: Secondary | ICD-10-CM

## 2017-08-08 ENCOUNTER — Encounter: Payer: Self-pay | Admitting: Osteopathic Medicine

## 2018-10-20 ENCOUNTER — Other Ambulatory Visit: Payer: Self-pay | Admitting: *Deleted

## 2018-10-20 DIAGNOSIS — Z122 Encounter for screening for malignant neoplasm of respiratory organs: Secondary | ICD-10-CM

## 2018-10-20 DIAGNOSIS — F17210 Nicotine dependence, cigarettes, uncomplicated: Secondary | ICD-10-CM

## 2018-10-30 ENCOUNTER — Ambulatory Visit
Admission: RE | Admit: 2018-10-30 | Discharge: 2018-10-30 | Disposition: A | Discharge status: Home / Self Care | Payer: Managed Care, Other (non HMO) | Source: Ambulatory Visit | Attending: Acute Care | Admitting: Acute Care

## 2018-10-30 DIAGNOSIS — Z122 Encounter for screening for malignant neoplasm of respiratory organs: Secondary | ICD-10-CM

## 2018-10-30 DIAGNOSIS — F17210 Nicotine dependence, cigarettes, uncomplicated: Secondary | ICD-10-CM

## 2018-11-09 ENCOUNTER — Other Ambulatory Visit (HOSPITAL_COMMUNITY): Payer: Self-pay | Admitting: Family Medicine

## 2018-11-09 DIAGNOSIS — R918 Other nonspecific abnormal finding of lung field: Secondary | ICD-10-CM

## 2018-11-10 ENCOUNTER — Other Ambulatory Visit: Payer: Self-pay | Admitting: *Deleted

## 2018-11-10 ENCOUNTER — Other Ambulatory Visit (HOSPITAL_COMMUNITY): Payer: Self-pay | Admitting: Family Medicine

## 2018-11-10 DIAGNOSIS — F17210 Nicotine dependence, cigarettes, uncomplicated: Secondary | ICD-10-CM

## 2018-11-10 DIAGNOSIS — Z122 Encounter for screening for malignant neoplasm of respiratory organs: Secondary | ICD-10-CM

## 2018-11-10 DIAGNOSIS — R918 Other nonspecific abnormal finding of lung field: Secondary | ICD-10-CM

## 2019-02-22 ENCOUNTER — Telehealth: Payer: Self-pay

## 2019-02-22 NOTE — Telephone Encounter (Signed)
Pt left a vm msg stating she had some questions regarding her medical records. Attempted to contact pt for clarification of request. No answer. Left a detailed msg to return call back to clinic with specific details of request. Direct call back info provided.

## 2019-04-30 ENCOUNTER — Ambulatory Visit
Admission: RE | Admit: 2019-04-30 | Discharge: 2019-04-30 | Disposition: A | Discharge status: Home / Self Care | Payer: BC Managed Care – PPO | Source: Ambulatory Visit | Attending: Acute Care | Admitting: Acute Care

## 2019-04-30 DIAGNOSIS — Z122 Encounter for screening for malignant neoplasm of respiratory organs: Secondary | ICD-10-CM

## 2019-04-30 DIAGNOSIS — F17210 Nicotine dependence, cigarettes, uncomplicated: Secondary | ICD-10-CM

## 2019-05-04 NOTE — Progress Notes (Signed)
Please call patient and let them  know their  low dose Ct was read as a Lung RADS 1, negative study: no nodules or definitely benign nodules. Radiology recommendation is for a repeat LDCT in 12 months. .Please let them  know we will order and schedule their  annual screening scan for 04/2020. Please let them  know there was notation of CAD on their  scan.  Please remind the patient  that this is a non-gated exam therefore degree or severity of disease  cannot be determined. Please have them  follow up with their PCP regarding potential risk factor modification, dietary therapy or pharmacologic therapy if clinically indicated. Pt.  is not  currently on statin therapy. Please place order for annual  screening scan for  04/2020 and fax results to PCP. Thanks so much. 

## 2019-05-05 ENCOUNTER — Encounter: Payer: Self-pay | Admitting: Osteopathic Medicine

## 2019-05-05 ENCOUNTER — Other Ambulatory Visit: Payer: Self-pay | Admitting: *Deleted

## 2019-05-05 DIAGNOSIS — F17210 Nicotine dependence, cigarettes, uncomplicated: Secondary | ICD-10-CM

## 2019-05-05 DIAGNOSIS — Z87891 Personal history of nicotine dependence: Secondary | ICD-10-CM

## 2019-05-05 DIAGNOSIS — I2510 Atherosclerotic heart disease of native coronary artery without angina pectoris: Secondary | ICD-10-CM

## 2019-05-05 HISTORY — DX: Atherosclerotic heart disease of native coronary artery without angina pectoris: I25.10

## 2020-05-22 ENCOUNTER — Other Ambulatory Visit: Payer: Self-pay | Admitting: *Deleted

## 2020-05-22 DIAGNOSIS — Z87891 Personal history of nicotine dependence: Secondary | ICD-10-CM

## 2020-05-22 DIAGNOSIS — F17210 Nicotine dependence, cigarettes, uncomplicated: Secondary | ICD-10-CM

## 2020-06-06 ENCOUNTER — Ambulatory Visit
Admission: RE | Admit: 2020-06-06 | Discharge: 2020-06-06 | Disposition: A | Discharge status: Home / Self Care | Payer: BC Managed Care – PPO | Source: Ambulatory Visit | Attending: Acute Care | Admitting: Acute Care

## 2020-06-06 DIAGNOSIS — Z87891 Personal history of nicotine dependence: Secondary | ICD-10-CM

## 2020-06-06 DIAGNOSIS — F17210 Nicotine dependence, cigarettes, uncomplicated: Secondary | ICD-10-CM

## 2020-06-17 NOTE — Progress Notes (Signed)
Please call patient and let them  know their  low dose Ct was read as a Lung  RADS 3, nodules that are probably benign findings, short term follow up suggested: includes nodules with a low likelihood of becoming a clinically active cancer. Radiology recommends a 6 month repeat LDCT follow up.  Please let them  know there was notation of CAD on their  scan.  Please remind the patient  that this is a non-gated exam therefore degree or severity of disease  cannot be determined. Please have them  follow up with their PCP regarding potential risk factor modification, dietary therapy or pharmacologic therapy if clinically indicated. Pt.  is  currently on statin therapy. Please place order for annual  screening scan for 05/2021 and fax results to PCP. Thanks so much.  Angelique Blonder, will you please call this result? Thanks so much.

## 2020-06-20 ENCOUNTER — Other Ambulatory Visit: Payer: Self-pay | Admitting: *Deleted

## 2020-06-20 DIAGNOSIS — Z87891 Personal history of nicotine dependence: Secondary | ICD-10-CM

## 2020-06-20 DIAGNOSIS — F17210 Nicotine dependence, cigarettes, uncomplicated: Secondary | ICD-10-CM

## 2021-01-12 ENCOUNTER — Ambulatory Visit
Admission: RE | Admit: 2021-01-12 | Discharge: 2021-01-12 | Disposition: A | Discharge status: Home / Self Care | Payer: No Typology Code available for payment source | Source: Ambulatory Visit | Attending: Acute Care | Admitting: Acute Care

## 2021-01-12 DIAGNOSIS — Z87891 Personal history of nicotine dependence: Secondary | ICD-10-CM

## 2021-01-12 DIAGNOSIS — F17210 Nicotine dependence, cigarettes, uncomplicated: Secondary | ICD-10-CM

## 2021-01-15 ENCOUNTER — Telehealth: Payer: Self-pay | Admitting: Acute Care

## 2021-01-15 NOTE — Telephone Encounter (Signed)
This is a call report from [facility]. FYI to provider.

## 2021-01-16 ENCOUNTER — Telehealth: Payer: Self-pay | Admitting: Acute Care

## 2021-01-16 NOTE — Telephone Encounter (Signed)
I have called the patient with the results of her low dose CT chest. I explained that there is a nodule that we need to take a closer look at. I told her it is a very small nodule, and that I would like her to see Dr. Tonia Brooms in the office as soon as he has an opening. She is in agreement with this.  Triage, please schedule with Dr. Tonia Brooms in a nodule slot as early as possible. When he and I were looking at his open appointments yesterday he had availability 12/21, 12/27, 12/28,12/30.  Please call patient to get this arranged.  Sierra Snyder, follow up will be based on Dr. Myrlene Snyder plan  after he sees the patient in the office  Thanks so much.

## 2021-01-18 ENCOUNTER — Telehealth: Payer: Self-pay | Admitting: Acute Care

## 2021-01-18 NOTE — Telephone Encounter (Signed)
Called and spoke with pt and was able to get her scheduled for an appt with Dr. Tonia Brooms. Nothing further needed.

## 2021-01-18 NOTE — Telephone Encounter (Signed)
This patient needs an appointment with Dr. Tonia Brooms for nosule follow up ( Nodule slot) He has openings 02/07/2021 at 11:30, 2 pm and 3 pm. I have tried to call the patient to get her scheduled, but there was no answer.  Triage, please call patient and get her scheduled as soon as possible. Thanks so much

## 2021-01-26 ENCOUNTER — Encounter: Payer: Self-pay | Admitting: Pulmonary Disease

## 2021-01-26 ENCOUNTER — Ambulatory Visit: Payer: No Typology Code available for payment source | Admitting: Pulmonary Disease

## 2021-01-26 ENCOUNTER — Other Ambulatory Visit: Payer: Self-pay

## 2021-01-26 VITALS — BP 124/70 | HR 93 | Temp 98.0°F | Ht 64.0 in | Wt 133.0 lb

## 2021-01-26 DIAGNOSIS — R918 Other nonspecific abnormal finding of lung field: Secondary | ICD-10-CM | POA: Diagnosis not present

## 2021-01-26 DIAGNOSIS — R911 Solitary pulmonary nodule: Secondary | ICD-10-CM | POA: Diagnosis present

## 2021-01-26 DIAGNOSIS — J432 Centrilobular emphysema: Secondary | ICD-10-CM

## 2021-01-26 DIAGNOSIS — Z720 Tobacco use: Secondary | ICD-10-CM

## 2021-01-26 HISTORY — DX: Solitary pulmonary nodule: R91.1

## 2021-01-26 NOTE — Patient Instructions (Signed)
Thank you for visiting Dr. Tonia Brooms at [facility]. Today we recommend the following:  Orders Placed This Encounter  Procedures   Procedural/ Surgical Case Request: ROBOTIC ASSISTED NAVIGATIONAL BRONCHOSCOPY   CT Super D Chest Wo Contrast   Ambulatory referral to Pulmonology   Tentative bronchoscopy date 02/06/2021  Return in about 19 days (around 02/14/2021) for with Kandice Robinsons, NP.    Please do your part to reduce the spread of COVID-19.

## 2021-01-26 NOTE — H&P (View-Only) (Signed)
Synopsis: Referred in December 2022 for lung nodule by Emeterio Reeve, DO  Subjective:   PATIENT ID: Sierra Snyder GENDER: female DOB: Apr 10, 1960, MRN: [id number]  Chief Complaint  Patient presents with   Follow-up    Pt seeing Dr. Valeta Harms per Eric Form, NP after CT.  Pt states she has been doing okay and denies any complaints.     This is a 60 year old female current everyday smoker.Seen in consultation today after follow-up in our lung cancer screening program.  She had a CT nodule follow-up that was completed on 01/12/2021.  CT scan of the chest reveals centrilobular emphysema as well as a solid component to a new nodule.  Patient has a small right upper lobe 8 mm spiculated nodule with associated emphysema.  CT imaging reviewed today in the office with patient.  She does not have any PFTs on file.  Patient has no respiratory complaints today.  Nodule was found on screening.   Past Medical History:  Diagnosis Date   Lung nodule    Right upper lobe   Tobacco use      Family History  Problem Relation Age of Onset   Stroke Mother 29   Diabetes Father      Past Surgical History:  Procedure Laterality Date   ABDOMINAL HYSTERECTOMY     HERNIA REPAIR     x 3    Social History   Socioeconomic History   Marital status: Married    Spouse name: Not on file   Number of children: Not on file   Years of education: Not on file   Highest education level: Not on file  Occupational History   Not on file  Tobacco Use   Smoking status: Every Day    Packs/day: 1.50    Years: 34.00    Pack years: 51.00    Types: Cigarettes    Start date: 1984   Smokeless tobacco: Never   Tobacco comments:    Smokes 0.5ppd as of 01/26/21 ep  Vaping Use   Vaping Use: Never used  Substance and Sexual Activity   Alcohol use: No   Drug use: No   Sexual activity: Yes    Birth control/protection: Surgical  Other Topics Concern   Not on file  Social History Narrative   Not on file   Social  Determinants of Health   Financial Resource Strain: Not on file  Food Insecurity: Not on file  Transportation Needs: Not on file  Physical Activity: Not on file  Stress: Not on file  Social Connections: Not on file  Intimate Partner Violence: Not on file     Allergies  Allergen Reactions   Allegra [Fexofenadine] Nausea Only   Aspirin Other (See Comments)    Stomach pain   Mucinex [Guaifenesin Er] Diarrhea   Claritin [Loratadine] Rash   Tamiflu [Oseltamivir Phosphate] Rash     Outpatient Medications Prior to Visit  Medication Sig Dispense Refill   atorvastatin (LIPITOR) 40 MG tablet Take 1 tablet (40 mg total) by mouth daily. 90 tablet 3   cetirizine (ZYRTEC) 10 MG tablet Take 10 mg by mouth daily as needed for allergies.     gabapentin (NEURONTIN) 100 MG capsule Take 100 mg by mouth 3 (three) times daily as needed.     SPIRIVA HANDIHALER 18 MCG inhalation capsule 1 capsule daily.     Tiotropium Bromide-Olodaterol (STIOLTO RESPIMAT) 2.5-2.5 MCG/ACT AERS Inhale 2 puffs into the lungs daily. 1 Inhaler 11   No facility-administered medications prior to  visit.    Review of Systems  Constitutional:  Negative for chills, fever, malaise/fatigue and weight loss.  HENT:  Negative for hearing loss, sore throat and tinnitus.   Eyes:  Negative for blurred vision and double vision.  Respiratory:  Negative for cough, hemoptysis, sputum production, shortness of breath, wheezing and stridor.   Cardiovascular:  Negative for chest pain, palpitations, orthopnea, leg swelling and PND.  Gastrointestinal:  Negative for abdominal pain, constipation, diarrhea, heartburn, nausea and vomiting.  Genitourinary:  Negative for dysuria, hematuria and urgency.  Musculoskeletal:  Negative for joint pain and myalgias.  Skin:  Negative for itching and rash.  Neurological:  Negative for dizziness, tingling, weakness and headaches.  Endo/Heme/Allergies:  Negative for environmental allergies. Does not  bruise/bleed easily.  Psychiatric/Behavioral:  Negative for depression. The patient is not nervous/anxious and does not have insomnia.   All other systems reviewed and are negative.   Objective:  Physical Exam Vitals reviewed.  Constitutional:      General: She is not in acute distress.    Appearance: She is well-developed.  HENT:     Head: Normocephalic and atraumatic.  Eyes:     General: No scleral icterus.    Conjunctiva/sclera: Conjunctivae normal.     Pupils: Pupils are equal, round, and reactive to light.  Neck:     Vascular: No JVD.     Trachea: No tracheal deviation.  Cardiovascular:     Rate and Rhythm: Normal rate and regular rhythm.     Heart sounds: Normal heart sounds. No murmur heard. Pulmonary:     Effort: Pulmonary effort is normal. No tachypnea, accessory muscle usage or respiratory distress.     Breath sounds: No stridor. No wheezing, rhonchi or rales.     Comments: Diminished breath sounds bilaterally Abdominal:     General: Bowel sounds are normal. There is no distension.     Palpations: Abdomen is soft.     Tenderness: There is no abdominal tenderness.  Musculoskeletal:        General: No tenderness.     Cervical back: Neck supple.  Lymphadenopathy:     Cervical: No cervical adenopathy.  Skin:    General: Skin is warm and dry.     Capillary Refill: Capillary refill takes less than 2 seconds.     Findings: No rash.  Neurological:     Mental Status: She is alert and oriented to person, place, and time.  Psychiatric:        Behavior: Behavior normal.     Vitals:   01/26/21 1448  BP: 124/70  Pulse: 93  Temp: 98 F (36.7 C)  TempSrc: Oral  SpO2: 97%  Weight: 133 lb (60.3 kg)  Height: 5\' 4"  (1.626 m)   97% on RA BMI Readings from Last 3 Encounters:  01/26/21 22.83 kg/m  07/04/17 22.85 kg/m  10/08/16 24.36 kg/m   Wt Readings from Last 3 Encounters:  01/26/21 133 lb (60.3 kg)  07/04/17 133 lb 1.6 oz (60.4 kg)  10/08/16 141 lb 14.4 oz  (64.4 kg)     CBC    Component Value Date/Time   WBC 7.2 07/04/2017 0957   RBC 5.06 07/04/2017 0957   HGB 15.4 07/04/2017 0957   HCT 45.0 07/04/2017 0957   PLT 276 07/04/2017 0957   MCV 88.9 07/04/2017 0957   MCH 30.4 07/04/2017 0957   MCHC 34.2 07/04/2017 0957   RDW 12.8 07/04/2017 0957   LYMPHSABS 2,944 02/14/2016 0923   MONOABS 448 02/14/2016 0923  EOSABS 192 02/14/2016 0923   BASOSABS 0 02/14/2016 0923     Chest Imaging: Lung cancer screening [place] [address] 22: Patient with suspicious lung RADS 4 a 8 mm pulmonary nodule within the apical segment of the right upper lobe. The patient's images have been independently reviewed by me.    Pulmonary Functions Testing Results: No flowsheet data found.  FeNO:   Pathology:   Echocardiogram:   Heart Catheterization:     Assessment & Plan:     ICD-10-CM   1. Right upper lobe pulmonary nodule  R91.1 Ambulatory referral to Pulmonology    CT Super D Chest Wo Contrast    Procedural/ Surgical Case Request: ROBOTIC ASSISTED NAVIGATIONAL BRONCHOSCOPY    2. Centrilobular emphysema ([place])  J43.2     3. Tobacco abuse  Z72.0     4. Abnormal CT lung screening  R91.8       Discussion:  This is a 60 year old female longstanding history of tobacco abuse, 50+ pack year history of smoking found to have a new 8 mm macrolobulated spiculated right upper lobe pulmonary nodule.  She has associated centrilobular emphysema and a longstanding history of tobacco abuse.  Plan: Due to how small the lesion is of not sure that a PET scan would be useful. As this nodule is new in comparison to previous imaging as well as well associated with morphologic characteristics concerning for malignancy as well as centrilobular emphysema I think that it is moderate to high risk for being a primary bronchogenic carcinoma. We discussed the pros and cons of watchful waiting with a repeat CT short-term in 3 months versus consideration for robotic assisted  bronchoscopy and tissue sampling. I think the lesion can be accessible via navigational approach. Patient is agreeable to proceed with tissue biopsy.  As she would like to have a answer earlier if possible. She is a daily smoker and has not had PFTs.  If this turns out to be a primary malignancy I think that she can get PFTs and consideration for surgery if she will quit smoking.  She is going to work on this over the coming weeks.  We will plan for bronchoscopy on 02/06/2021. We appreciate PCC's help with scheduling as well as scheduling the CT scan prior.    Current Outpatient Medications:    atorvastatin (LIPITOR) 40 MG tablet, Take 1 tablet (40 mg total) by mouth daily., Disp: 90 tablet, Rfl: 3   cetirizine (ZYRTEC) 10 MG tablet, Take 10 mg by mouth daily as needed for allergies., Disp: , Rfl:    gabapentin (NEURONTIN) 100 MG capsule, Take 100 mg by mouth 3 (three) times daily as needed., Disp: , Rfl:    SPIRIVA HANDIHALER 18 MCG inhalation capsule, 1 capsule daily., Disp: , Rfl:    I spent 63 minutes dedicated to the care of this patient on the date of this encounter to include pre-visit review of records, face-to-face time with the patient discussing conditions above, post visit ordering of testing, clinical documentation with the electronic health record, making appropriate referrals as documented, and communicating necessary findings to members of the patients care team.   Garner Nash, DO [facility] 01/26/2021 3:23 PM

## 2021-01-26 NOTE — Progress Notes (Signed)
Synopsis: Referred in December 2022 for lung nodule by Emeterio Reeve, DO  Subjective:   PATIENT ID: Sierra Snyder GENDER: female DOB: [birth year], MRN: [id number]  Chief Complaint  Patient presents with   Follow-up    Pt seeing Dr. Valeta Harms per Eric Form, NP after CT.  Pt states she has been doing okay and denies any complaints.     This is a 60 year old female current everyday smoker.Seen in consultation today after follow-up in our lung cancer screening program.  She had a CT nodule follow-up that was completed on 01/12/2021.  CT scan of the chest reveals centrilobular emphysema as well as a solid component to a new nodule.  Patient has a small right upper lobe 8 mm spiculated nodule with associated emphysema.  CT imaging reviewed today in the office with patient.  She does not have any PFTs on file.  Patient has no respiratory complaints today.  Nodule was found on screening.   Past Medical History:  Diagnosis Date   Lung nodule    Right upper lobe   Tobacco use      Family History  Problem Relation Age of Onset   Stroke Mother 68   Diabetes Father      Past Surgical History:  Procedure Laterality Date   ABDOMINAL HYSTERECTOMY     HERNIA REPAIR     x 3    Social History   Socioeconomic History   Marital status: Married    Spouse name: Not on file   Number of children: Not on file   Years of education: Not on file   Highest education level: Not on file  Occupational History   Not on file  Tobacco Use   Smoking status: Every Day    Packs/day: 1.50    Years: 34.00    Pack years: 51.00    Types: Cigarettes    Start date: 1984   Smokeless tobacco: Never   Tobacco comments:    Smokes 0.5ppd as of 01/26/21 ep  Vaping Use   Vaping Use: Never used  Substance and Sexual Activity   Alcohol use: No   Drug use: No   Sexual activity: Yes    Birth control/protection: Surgical  Other Topics Concern   Not on file  Social History Narrative   Not on file   Social  Determinants of Health   Financial Resource Strain: Not on file  Food Insecurity: Not on file  Transportation Needs: Not on file  Physical Activity: Not on file  Stress: Not on file  Social Connections: Not on file  Intimate Partner Violence: Not on file     Allergies  Allergen Reactions   Allegra [Fexofenadine] Nausea Only   Aspirin Other (See Comments)    Stomach pain   Mucinex [Guaifenesin Er] Diarrhea   Claritin [Loratadine] Rash   Tamiflu [Oseltamivir Phosphate] Rash     Outpatient Medications Prior to Visit  Medication Sig Dispense Refill   atorvastatin (LIPITOR) 40 MG tablet Take 1 tablet (40 mg total) by mouth daily. 90 tablet 3   cetirizine (ZYRTEC) 10 MG tablet Take 10 mg by mouth daily as needed for allergies.     gabapentin (NEURONTIN) 100 MG capsule Take 100 mg by mouth 3 (three) times daily as needed.     SPIRIVA HANDIHALER 18 MCG inhalation capsule 1 capsule daily.     Tiotropium Bromide-Olodaterol (STIOLTO RESPIMAT) 2.5-2.5 MCG/ACT AERS Inhale 2 puffs into the lungs daily. 1 Inhaler 11   No facility-administered medications prior to  visit.    Review of Systems  Constitutional:  Negative for chills, fever, malaise/fatigue and weight loss.  HENT:  Negative for hearing loss, sore throat and tinnitus.   Eyes:  Negative for blurred vision and double vision.  Respiratory:  Negative for cough, hemoptysis, sputum production, shortness of breath, wheezing and stridor.   Cardiovascular:  Negative for chest pain, palpitations, orthopnea, leg swelling and PND.  Gastrointestinal:  Negative for abdominal pain, constipation, diarrhea, heartburn, nausea and vomiting.  Genitourinary:  Negative for dysuria, hematuria and urgency.  Musculoskeletal:  Negative for joint pain and myalgias.  Skin:  Negative for itching and rash.  Neurological:  Negative for dizziness, tingling, weakness and headaches.  Endo/Heme/Allergies:  Negative for environmental allergies. Does not  bruise/bleed easily.  Psychiatric/Behavioral:  Negative for depression. The patient is not nervous/anxious and does not have insomnia.   All other systems reviewed and are negative.   Objective:  Physical Exam Vitals reviewed.  Constitutional:      General: She is not in acute distress.    Appearance: She is well-developed.  HENT:     Head: Normocephalic and atraumatic.  Eyes:     General: No scleral icterus.    Conjunctiva/sclera: Conjunctivae normal.     Pupils: Pupils are equal, round, and reactive to light.  Neck:     Vascular: No JVD.     Trachea: No tracheal deviation.  Cardiovascular:     Rate and Rhythm: Normal rate and regular rhythm.     Heart sounds: Normal heart sounds. No murmur heard. Pulmonary:     Effort: Pulmonary effort is normal. No tachypnea, accessory muscle usage or respiratory distress.     Breath sounds: No stridor. No wheezing, rhonchi or rales.     Comments: Diminished breath sounds bilaterally Abdominal:     General: Bowel sounds are normal. There is no distension.     Palpations: Abdomen is soft.     Tenderness: There is no abdominal tenderness.  Musculoskeletal:        General: No tenderness.     Cervical back: Neck supple.  Lymphadenopathy:     Cervical: No cervical adenopathy.  Skin:    General: Skin is warm and dry.     Capillary Refill: Capillary refill takes less than 2 seconds.     Findings: No rash.  Neurological:     Mental Status: She is alert and oriented to person, place, and time.  Psychiatric:        Behavior: Behavior normal.     Vitals:   01/26/21 1448  BP: 124/70  Pulse: 93  Temp: 98 F (36.7 C)  TempSrc: Oral  SpO2: 97%  Weight: 133 lb (60.3 kg)  Height: 5\' 4"  (1.626 m)   97% on RA BMI Readings from Last 3 Encounters:  01/26/21 22.83 kg/m  07/04/17 22.85 kg/m  10/08/16 24.36 kg/m   Wt Readings from Last 3 Encounters:  01/26/21 133 lb (60.3 kg)  07/04/17 133 lb 1.6 oz (60.4 kg)  10/08/16 141 lb 14.4 oz  (64.4 kg)     CBC    Component Value Date/Time   WBC 7.2 07/04/2017 0957   RBC 5.06 07/04/2017 0957   HGB 15.4 07/04/2017 0957   HCT 45.0 07/04/2017 0957   PLT 276 07/04/2017 0957   MCV 88.9 07/04/2017 0957   MCH 30.4 07/04/2017 0957   MCHC 34.2 07/04/2017 0957   RDW 12.8 07/04/2017 0957   LYMPHSABS 2,944 02/14/2016 0923   MONOABS 448 02/14/2016 0923  EOSABS 192 02/14/2016 0923   BASOSABS 0 02/14/2016 0923     Chest Imaging: Lung cancer screening [place] [address] 22: Patient with suspicious lung RADS 4 a 8 mm pulmonary nodule within the apical segment of the right upper lobe. The patient's images have been independently reviewed by me.    Pulmonary Functions Testing Results: No flowsheet data found.  FeNO:   Pathology:   Echocardiogram:   Heart Catheterization:     Assessment & Plan:     ICD-10-CM   1. Right upper lobe pulmonary nodule  R91.1 Ambulatory referral to Pulmonology    CT Super D Chest Wo Contrast    Procedural/ Surgical Case Request: ROBOTIC ASSISTED NAVIGATIONAL BRONCHOSCOPY    2. Centrilobular emphysema ([place])  J43.2     3. Tobacco abuse  Z72.0     4. Abnormal CT lung screening  R91.8       Discussion:  This is a 60 year old female longstanding history of tobacco abuse, 50+ pack year history of smoking found to have a new 8 mm macrolobulated spiculated right upper lobe pulmonary nodule.  She has associated centrilobular emphysema and a longstanding history of tobacco abuse.  Plan: Due to how small the lesion is of not sure that a PET scan would be useful. As this nodule is new in comparison to previous imaging as well as well associated with morphologic characteristics concerning for malignancy as well as centrilobular emphysema I think that it is moderate to high risk for being a primary bronchogenic carcinoma. We discussed the pros and cons of watchful waiting with a repeat CT short-term in 3 months versus consideration for robotic assisted  bronchoscopy and tissue sampling. I think the lesion can be accessible via navigational approach. Patient is agreeable to proceed with tissue biopsy.  As she would like to have a answer earlier if possible. She is a daily smoker and has not had PFTs.  If this turns out to be a primary malignancy I think that she can get PFTs and consideration for surgery if she will quit smoking.  She is going to work on this over the coming weeks.  We will plan for bronchoscopy on 02/06/2021. We appreciate PCC's help with scheduling as well as scheduling the CT scan prior.    Current Outpatient Medications:    atorvastatin (LIPITOR) 40 MG tablet, Take 1 tablet (40 mg total) by mouth daily., Disp: 90 tablet, Rfl: 3   cetirizine (ZYRTEC) 10 MG tablet, Take 10 mg by mouth daily as needed for allergies., Disp: , Rfl:    gabapentin (NEURONTIN) 100 MG capsule, Take 100 mg by mouth 3 (three) times daily as needed., Disp: , Rfl:    SPIRIVA HANDIHALER 18 MCG inhalation capsule, 1 capsule daily., Disp: , Rfl:    I spent 63 minutes dedicated to the care of this patient on the date of this encounter to include pre-visit review of records, face-to-face time with the patient discussing conditions above, post visit ordering of testing, clinical documentation with the electronic health record, making appropriate referrals as documented, and communicating necessary findings to members of the patients care team.   Garner Nash, DO [facility] 01/26/2021 3:23 PM

## 2021-01-29 ENCOUNTER — Telehealth: Payer: Self-pay

## 2021-01-29 NOTE — Telephone Encounter (Signed)
Opened in error

## 2021-01-31 ENCOUNTER — Encounter (HOSPITAL_COMMUNITY): Payer: Self-pay | Admitting: Pulmonary Disease

## 2021-01-31 NOTE — Progress Notes (Signed)
DUE TO COVID-19 ONLY ONE VISITOR IS ALLOWED TO COME WITH YOU AND STAY IN THE WAITING ROOM ONLY DURING PRE OP AND PROCEDURE DAY OF SURGERY.   PCP - Dr. Maris Berger Cardiologist - n/a  CT Chest x-ray - 01/12/21 EKG - 07/04/17 Stress Test - 07/04/17 ECHO - n/a Cardiac Cath - n/a  ICD Pacemaker/Loop - n/a  Sleep Study -  n/a CPAP - none  STOP now taking any Aspirin (unless otherwise instructed by your surgeon), Aleve, Naproxen, Ibuprofen, Motrin, Advil, Goody's, BC's, all herbal medications, fish oil, and all vitamins.   Coronavirus Screening Covid test is scheduled on DOS Do you have any of the following symptoms:  Cough yes/no: No Fever (>100.39F)  yes/no: No Runny nose yes/no: No Sore throat yes/no: No Difficulty breathing/shortness of breath  yes/no: No  Have you traveled in the last 14 days and where? yes/no: No  Patient verbalized understanding of instructions that were given via phone.

## 2021-02-01 ENCOUNTER — Ambulatory Visit (INDEPENDENT_AMBULATORY_CARE_PROVIDER_SITE_OTHER)
Admission: RE | Admit: 2021-02-01 | Discharge: 2021-02-01 | Disposition: A | Discharge status: Home / Self Care | Payer: No Typology Code available for payment source | Source: Ambulatory Visit | Attending: Pulmonary Disease | Admitting: Pulmonary Disease

## 2021-02-01 ENCOUNTER — Other Ambulatory Visit: Payer: Self-pay

## 2021-02-01 DIAGNOSIS — R911 Solitary pulmonary nodule: Secondary | ICD-10-CM | POA: Diagnosis not present

## 2021-02-06 ENCOUNTER — Ambulatory Visit (HOSPITAL_COMMUNITY): Payer: No Typology Code available for payment source | Admitting: Anesthesiology

## 2021-02-06 ENCOUNTER — Ambulatory Visit (HOSPITAL_COMMUNITY): Payer: No Typology Code available for payment source

## 2021-02-06 ENCOUNTER — Encounter (HOSPITAL_COMMUNITY): Payer: Self-pay | Admitting: Pulmonary Disease

## 2021-02-06 ENCOUNTER — Encounter (HOSPITAL_COMMUNITY)
Admission: RE | Disposition: A | Discharge status: Home / Self Care | Payer: Self-pay | Source: Home / Self Care | Attending: Pulmonary Disease

## 2021-02-06 ENCOUNTER — Ambulatory Visit (HOSPITAL_COMMUNITY)
Admission: RE | Admit: 2021-02-06 | Discharge: 2021-02-06 | Disposition: A | Discharge status: Home / Self Care | Payer: No Typology Code available for payment source | Attending: Pulmonary Disease | Admitting: Pulmonary Disease

## 2021-02-06 ENCOUNTER — Other Ambulatory Visit: Payer: Self-pay

## 2021-02-06 DIAGNOSIS — R911 Solitary pulmonary nodule: Secondary | ICD-10-CM | POA: Insufficient documentation

## 2021-02-06 DIAGNOSIS — F17210 Nicotine dependence, cigarettes, uncomplicated: Secondary | ICD-10-CM | POA: Insufficient documentation

## 2021-02-06 DIAGNOSIS — Z20822 Contact with and (suspected) exposure to covid-19: Secondary | ICD-10-CM | POA: Diagnosis not present

## 2021-02-06 DIAGNOSIS — Z9889 Other specified postprocedural states: Secondary | ICD-10-CM

## 2021-02-06 DIAGNOSIS — J432 Centrilobular emphysema: Secondary | ICD-10-CM | POA: Diagnosis not present

## 2021-02-06 DIAGNOSIS — Z419 Encounter for procedure for purposes other than remedying health state, unspecified: Secondary | ICD-10-CM

## 2021-02-06 DIAGNOSIS — F32A Depression, unspecified: Secondary | ICD-10-CM | POA: Diagnosis not present

## 2021-02-06 HISTORY — DX: Hyperlipidemia, unspecified: E78.5

## 2021-02-06 HISTORY — PX: BRONCHIAL BRUSHINGS: SHX5108

## 2021-02-06 HISTORY — PX: VIDEO BRONCHOSCOPY WITH RADIAL ENDOBRONCHIAL ULTRASOUND: SHX6849

## 2021-02-06 HISTORY — DX: Dyspnea, unspecified: R06.00

## 2021-02-06 HISTORY — PX: BRONCHIAL BIOPSY: SHX5109

## 2021-02-06 HISTORY — PX: BRONCHIAL WASHINGS: SHX5105

## 2021-02-06 HISTORY — DX: Chronic obstructive pulmonary disease, unspecified: J44.9

## 2021-02-06 HISTORY — DX: Other seasonal allergic rhinitis: J30.2

## 2021-02-06 LAB — CBC
HCT: 48.6 % — ABNORMAL HIGH (ref 36.0–46.0)
Hemoglobin: 16.7 g/dL — ABNORMAL HIGH (ref 12.0–15.0)
MCH: 31 pg (ref 26.0–34.0)
MCHC: 34.4 g/dL (ref 30.0–36.0)
MCV: 90.3 fL (ref 80.0–100.0)
Platelets: 245 10*3/uL (ref 150–400)
RBC: 5.38 MIL/uL — ABNORMAL HIGH (ref 3.87–5.11)
RDW: 13.4 % (ref 11.5–15.5)
WBC: 6.5 10*3/uL (ref 4.0–10.5)
nRBC: 0 % (ref 0.0–0.2)

## 2021-02-06 LAB — SARS CORONAVIRUS 2 BY RT PCR (HOSPITAL ORDER, PERFORMED IN ~~LOC~~ HOSPITAL LAB): SARS Coronavirus 2: NEGATIVE

## 2021-02-06 SURGERY — BRONCHOSCOPY, WITH BIOPSY USING ELECTROMAGNETIC NAVIGATION
Anesthesia: General | Laterality: Right

## 2021-02-06 MED ORDER — FENTANYL CITRATE (PF) 250 MCG/5ML IJ SOLN: INTRAMUSCULAR | Status: DC | PRN

## 2021-02-06 MED ORDER — ONDANSETRON HCL 4 MG/2ML IJ SOLN
INTRAMUSCULAR | Status: DC | PRN
  Administered 2021-02-06: 4 mg via INTRAVENOUS

## 2021-02-06 MED ORDER — PHENYLEPHRINE HCL-NACL 20-0.9 MG/250ML-% IV SOLN
INTRAVENOUS | Status: DC | PRN
  Administered 2021-02-06: 50 ug/min via INTRAVENOUS

## 2021-02-06 MED ORDER — AMISULPRIDE (ANTIEMETIC) 5 MG/2ML IV SOLN
10.0000 mg | Freq: Once | INTRAVENOUS | Status: DC | PRN
  Filled 2021-02-06: qty 4

## 2021-02-06 MED ORDER — LIDOCAINE 2% (20 MG/ML) 5 ML SYRINGE
INTRAMUSCULAR | Status: DC | PRN
  Administered 2021-02-06: 60 mg via INTRAVENOUS

## 2021-02-06 MED ORDER — FENTANYL CITRATE (PF) 100 MCG/2ML IJ SOLN: 25.0000 ug | INTRAMUSCULAR | Status: DC | PRN

## 2021-02-06 MED ORDER — FENTANYL CITRATE (PF) 250 MCG/5ML IJ SOLN
INTRAMUSCULAR | Status: DC | PRN
  Administered 2021-02-06: 100 ug via INTRAVENOUS

## 2021-02-06 MED ORDER — LACTATED RINGERS IV SOLN: INTRAVENOUS | Status: DC

## 2021-02-06 MED ORDER — PHENYLEPHRINE 40 MCG/ML (10ML) SYRINGE FOR IV PUSH (FOR BLOOD PRESSURE SUPPORT)
PREFILLED_SYRINGE | INTRAVENOUS | Status: DC | PRN
  Administered 2021-02-06 (×2): 160 ug via INTRAVENOUS

## 2021-02-06 MED ORDER — PROPOFOL 10 MG/ML IV BOLUS
INTRAVENOUS | Status: DC | PRN
  Administered 2021-02-06: 160 mg via INTRAVENOUS

## 2021-02-06 MED ORDER — SUGAMMADEX SODIUM 200 MG/2ML IV SOLN
INTRAVENOUS | Status: DC | PRN
  Administered 2021-02-06: 200 mg via INTRAVENOUS

## 2021-02-06 MED ORDER — CHLORHEXIDINE GLUCONATE 0.12 % MT SOLN
OROMUCOSAL | Status: AC
  Administered 2021-02-06: 11:00:00 15 mL via OROMUCOSAL
  Filled 2021-02-06: qty 15

## 2021-02-06 MED ORDER — CHLORHEXIDINE GLUCONATE 0.12 % MT SOLN: 15.0000 mL | Freq: Once | OROMUCOSAL | Status: AC

## 2021-02-06 MED ORDER — PROMETHAZINE HCL 25 MG/ML IJ SOLN: 6.2500 mg | INTRAMUSCULAR | Status: DC | PRN

## 2021-02-06 MED ORDER — ROCURONIUM BROMIDE 10 MG/ML (PF) SYRINGE
PREFILLED_SYRINGE | INTRAVENOUS | Status: DC | PRN
  Administered 2021-02-06: 60 mg via INTRAVENOUS

## 2021-02-06 MED ORDER — DEXAMETHASONE SODIUM PHOSPHATE 10 MG/ML IJ SOLN
INTRAMUSCULAR | Status: DC | PRN
  Administered 2021-02-06: 10 mg via INTRAVENOUS

## 2021-02-06 NOTE — Op Note (Signed)
Video Bronchoscopy with Robotic Assisted Bronchoscopic Navigation   Date of Operation: 02/06/2021   Pre-op Diagnosis: Right upper lobe pulmonary nodule  Post-op Diagnosis: Right upper lobe pulmonary nodule  Surgeon: Garner Nash, DO   Assistants: None   Anesthesia: General endotracheal anesthesia  Operation: Flexible video fiberoptic bronchoscopy with robotic assistance and biopsies.  Estimated Blood Loss: Minimal  Complications: None  Indications and History: Sierra Snyder is a 60 y.o. female with history of right upper lobe pulmonary nodule. The risks, benefits, complications, treatment options and expected outcomes were discussed with the patient.  The possibilities of pneumothorax, pneumonia, reaction to medication, pulmonary aspiration, perforation of a viscus, bleeding, failure to diagnose a condition and creating a complication requiring transfusion or operation were discussed with the patient who freely signed the consent.    Description of Procedure: The patient was seen in the Preoperative Area, was examined and was deemed appropriate to proceed.  The patient was taken to [facility] endoscopy room 3, identified as Sierra Snyder and the procedure verified as Flexible Video Fiberoptic Bronchoscopy.  A Time Out was held and the above information confirmed.   Prior to the date of the procedure a high-resolution CT scan of the chest was performed. Utilizing ION software program a virtual tracheobronchial tree was generated to allow the creation of distinct navigation pathways to the patient's parenchymal abnormalities. After being taken to the operating room general anesthesia was initiated and the patient  was orally intubated. The video fiberoptic bronchoscope was introduced via the endotracheal tube and a general inspection was performed which showed normal right and left lung anatomy, aspiration of the bilateral mainstems was completed to remove any remaining secretions. Robotic catheter  inserted into patient's endotracheal tube.   Target #1 right upper lobe: The distinct navigation pathways prepared prior to this procedure were then utilized to navigate to patient's lesion identified on CT scan. The robotic catheter was secured into place and the vision probe was withdrawn.  Lesion location was approximated using fluoroscopy, three-dimensional cone beam CT imaging, and radial endobronchial ultrasound for peripheral targeting. Under fluoroscopic guidance transbronchial needle brushings, and transbronchial forceps biopsies were performed to be sent for cytology and pathology. A bronchioalveolar lavage was performed in the right upper lobe and sent for microbiology.  At the end of the procedure a general airway inspection was performed and there was no evidence of active bleeding. The bronchoscope was removed.  The patient tolerated the procedure well. There was no significant blood loss and there were no obvious complications. A post-procedural chest x-ray is pending.  Samples Target #1: 1. Transbronchial needle brushings from right upper lobe 2. Transbronchial Wang needle biopsies from right upper lobe 3. Transbronchial forceps biopsies from right upper lobe 4. Bronchoalveolar lavage from right upper lobe  Plans:  The patient will be discharged from the PACU to home when recovered from anesthesia and after chest x-ray is reviewed. We will review the cytology, pathology and microbiology results with the patient when they become available. Outpatient followup will be with Garner Nash, DO.   Garner Nash, DO [facility] 02/06/2021 12:58 PM

## 2021-02-06 NOTE — Transfer of Care (Signed)
Immediate Anesthesia Transfer of Care Note  Patient: Sierra Snyder  Procedure(s) Performed: ROBOTIC ASSISTED NAVIGATIONAL BRONCHOSCOPY (Right) BRONCHIAL BIOPSIES BRONCHIAL BRUSHINGS RADIAL ENDOBRONCHIAL ULTRASOUND BRONCHIAL WASHINGS  Patient Location: PACU  Anesthesia Type:General  Level of Consciousness: patient cooperative and responds to stimulation  Airway & Oxygen Therapy: Patient Spontanous Breathing  Post-op Assessment: Report given to RN and Post -op Vital signs reviewed and stable  Post vital signs: Reviewed and stable  Last Vitals:  Vitals Value Taken Time  BP 130/68 02/06/21 1302  Temp    Pulse 85 02/06/21 1303  Resp 23 02/06/21 1303  SpO2 96 % 02/06/21 1303  Vitals shown include unvalidated device data.  Last Pain:  Vitals:   02/06/21 1059  TempSrc:   PainSc: 0-No pain         Complications: No notable events documented.

## 2021-02-06 NOTE — Discharge Instructions (Signed)
Flexible Bronchoscopy, Care After This sheet gives you information about how to care for yourself after your test. Your doctor may also give you more specific instructions. If you have problems or questions, contact your doctor. Follow these instructions at home: Eating and drinking Do not eat or drink anything (not even water) for 2 hours after your test, or until your numbing medicine (local anesthetic) wears off. When your numbness is gone and your cough and gag reflexes have come back, you may: Eat only soft foods. Slowly drink liquids. The day after the test, go back to your normal diet. Driving Do not drive for 24 hours if you were given a medicine to help you relax (sedative). Do not drive or use heavy machinery while taking prescription pain medicine. General instructions  Take over-the-counter and prescription medicines only as told by your doctor. Return to your normal activities as told. Ask what activities are safe for you. Do not use any products that have nicotine or tobacco in them. This includes cigarettes and e-cigarettes. If you need help quitting, ask your doctor. Keep all follow-up visits as told by your doctor. This is important. It is very important if you had a tissue sample (biopsy) taken. Get help right away if: You have shortness of breath that gets worse. You get light-headed. You feel like you are going to pass out (faint). You have chest pain. You cough up: More than a little blood. More blood than before. Summary Do not eat or drink anything (not even water) for 2 hours after your test, or until your numbing medicine wears off. Do not use cigarettes. Do not use e-cigarettes. Get help right away if you have chest pain.  This information is not intended to replace advice given to you by your health care provider. Make sure you discuss any questions you have with your health care provider. Document Released: 11/25/2008 Document Revised: 01/10/2017 Document  Reviewed: 02/16/2016 Elsevier Patient Education  2020 [employer].

## 2021-02-06 NOTE — Anesthesia Preprocedure Evaluation (Addendum)
Anesthesia Evaluation  Patient identified by MRN, date of birth, ID band Patient awake    Reviewed: Allergy & Precautions, NPO status , Patient's Chart, lab work & pertinent test results  History of Anesthesia Complications Negative for: history of anesthetic complications  Airway Mallampati: II  TM Distance: >3 FB Neck ROM: Full    Dental  (+) Dental Advisory Given, Edentulous Upper   Pulmonary COPD,  COPD inhaler, Current Smoker,    Pulmonary exam normal        Cardiovascular negative cardio ROS Normal cardiovascular exam     Neuro/Psych PSYCHIATRIC DISORDERS Depression negative neurological ROS     GI/Hepatic negative GI ROS, Neg liver ROS,   Endo/Other  negative endocrine ROS  Renal/GU negative Renal ROS     Musculoskeletal negative musculoskeletal ROS (+)   Abdominal   Peds  Hematology negative hematology ROS (+)   Anesthesia Other Findings   Reproductive/Obstetrics                            Anesthesia Physical Anesthesia Plan  ASA: 2  Anesthesia Plan: General   Post-op Pain Management:    Induction: Intravenous  PONV Risk Score and Plan: 2 and Ondansetron and Dexamethasone  Airway Management Planned: Oral ETT  Additional Equipment:   Intra-op Plan:   Post-operative Plan: Extubation in OR  Informed Consent: I have reviewed the patients History and Physical, chart, labs and discussed the procedure including the risks, benefits and alternatives for the proposed anesthesia with the patient or authorized representative who has indicated his/her understanding and acceptance.     Dental advisory given  Plan Discussed with: Anesthesiologist and CRNA  Anesthesia Plan Comments:        Anesthesia Quick Evaluation

## 2021-02-06 NOTE — Interval H&P Note (Signed)
History and Physical Interval Note:  02/06/2021 12:04 PM  Sierra Snyder  has presented today for surgery, with the diagnosis of Lung nodule.  The various methods of treatment have been discussed with the patient and family. After consideration of risks, benefits and other options for treatment, the patient has consented to  Procedure(s) with comments: ROBOTIC ASSISTED NAVIGATIONAL BRONCHOSCOPY (Right) - ION w/ CIOS as a surgical intervention.  The patient's history has been reviewed, patient examined, no change in status, stable for surgery.  I have reviewed the patient's chart and labs.  Questions were answered to the patient's satisfaction.     Rachel Bo Mandrell Vangilder

## 2021-02-06 NOTE — Anesthesia Procedure Notes (Signed)
Procedure Name: Intubation Date/Time: 02/06/2021 12:15 PM Performed by: Betha Loa, CRNA Pre-anesthesia Checklist: Patient identified, Emergency Drugs available, Suction available and Patient being monitored Patient Re-evaluated:Patient Re-evaluated prior to induction Oxygen Delivery Method: Circle System Utilized Preoxygenation: Pre-oxygenation with 100% oxygen Induction Type: IV induction Ventilation: Mask ventilation without difficulty Laryngoscope Size: Mac and 3 Grade View: Grade I Tube type: Oral Tube size: 8.5 mm Number of attempts: 1 Airway Equipment and Method: Stylet Placement Confirmation: ETT inserted through vocal cords under direct vision, positive ETCO2 and breath sounds checked- equal and bilateral Secured at: 21 cm Tube secured with: Tape Dental Injury: Teeth and Oropharynx as per pre-operative assessment

## 2021-02-07 ENCOUNTER — Encounter (HOSPITAL_COMMUNITY): Payer: Self-pay | Admitting: Pulmonary Disease

## 2021-02-07 LAB — CYTOLOGY - NON PAP

## 2021-02-08 LAB — CULTURE, BAL-QUANTITATIVE W GRAM STAIN: Culture: 20000 — AB

## 2021-02-08 NOTE — Anesthesia Postprocedure Evaluation (Signed)
Anesthesia Post Note  Patient: Engineer, mining  Procedure(s) Performed: ROBOTIC ASSISTED NAVIGATIONAL BRONCHOSCOPY (Right) BRONCHIAL BIOPSIES BRONCHIAL BRUSHINGS RADIAL ENDOBRONCHIAL ULTRASOUND BRONCHIAL WASHINGS     Patient location during evaluation: PACU Anesthesia Type: General Level of consciousness: awake and alert Pain management: pain level controlled Vital Signs Assessment: post-procedure vital signs reviewed and stable Respiratory status: spontaneous breathing, nonlabored ventilation, respiratory function stable and patient connected to nasal cannula oxygen Cardiovascular status: blood pressure returned to baseline and stable Postop Assessment: no apparent nausea or vomiting Anesthetic complications: no   No notable events documented.  Last Vitals:  Vitals:   02/06/21 1315 02/06/21 1332  BP: 121/68 108/65  Pulse: 92 84  Resp: 20 (!) 22  Temp:  36.7 C  SpO2: 94% 93%    Last Pain:  Vitals:   02/06/21 1332  TempSrc:   PainSc: 0-No pain                 Mayrani Khamis S

## 2021-02-11 LAB — AEROBIC/ANAEROBIC CULTURE W GRAM STAIN (SURGICAL/DEEP WOUND)

## 2021-02-13 LAB — ACID FAST SMEAR (AFB, MYCOBACTERIA): Acid Fast Smear: NEGATIVE

## 2021-02-14 ENCOUNTER — Encounter: Payer: Self-pay | Admitting: Acute Care

## 2021-02-14 ENCOUNTER — Other Ambulatory Visit: Payer: Self-pay

## 2021-02-14 ENCOUNTER — Ambulatory Visit: Payer: No Typology Code available for payment source | Admitting: Acute Care

## 2021-02-14 VITALS — BP 118/64 | HR 99 | Temp 98.1°F | Ht 64.0 in | Wt 132.8 lb

## 2021-02-14 DIAGNOSIS — Z87891 Personal history of nicotine dependence: Secondary | ICD-10-CM

## 2021-02-14 DIAGNOSIS — R918 Other nonspecific abnormal finding of lung field: Secondary | ICD-10-CM | POA: Diagnosis not present

## 2021-02-14 DIAGNOSIS — F17210 Nicotine dependence, cigarettes, uncomplicated: Secondary | ICD-10-CM | POA: Diagnosis not present

## 2021-02-14 DIAGNOSIS — R911 Solitary pulmonary nodule: Secondary | ICD-10-CM

## 2021-02-14 DIAGNOSIS — J069 Acute upper respiratory infection, unspecified: Secondary | ICD-10-CM

## 2021-02-14 MED ORDER — CLINDAMYCIN HCL 150 MG PO CAPS: 150.0000 mg | ORAL_CAPSULE | Freq: Three times a day (TID) | ORAL | 0 refills | Status: AC

## 2021-02-14 NOTE — Patient Instructions (Addendum)
It is good to see you today. Your biopsy did not show malignant cells.  This is good news, but we need to continue to follow this nodule. We will plan on a 6 month follow up low dose CT chest through the screening program.  This will be due June of 2023.  We will call you to get this scheduled.  There was a bacteria in one of the cultures Dr. Tonia Brooms obtained.  We will treat this with Clindamycin 150 mg  every 6 hours for 7 days/ Take this antibiotic with a Probiotic ( Culturelle or Activia Yogurt daily)  Follow up in 6 months after CT chest is done . Please work on quitting smoking completely.  Call if you need [place] sooner.  Please contact office for sooner follow up if symptoms do not improve or worsen or seek emergency care  Call [phone] QUIT NOW for free nicotine patches gum or mints.

## 2021-02-14 NOTE — Progress Notes (Signed)
History of Present Illness Sierra Snyder is a 61 y.o. female current everyday smoker followed through the lung cancer screening program. She had an abnormal screening scan ,  and was found to have a new 8 mm macrolobulated spiculated right upper lobe pulmonary nodule.She had a ROBOTIC ASSISTED NAVIGATIONAL BRONCHOSCOPY by Dr. Valeta Harms on 02/06/2021 for tissue sampling. She is here for follow up.    02/14/2021 Pt. Presents for follow up. Biopsy was negative for malignant cells. She states she did well after her procedure. Minimal blood post procedure. She did have a sore throat for 3 days, but this seld resolved.  She is working on quitting smoking. She has patches from her PCP, and I have also provided her with the 1-800-QUIT NOW number for free patches , gum or mints.  We discussed her cytology was negative for malignant cells. We will do follow up imaging in 6 months to re-evaluate the nodules of concern. She is in agreement with this plan. I will check with Dr. Valeta Harms regarding a Super D follow up vs PET, vs a Low Dose CT through the screening program. ( Goal to return her to the screening program ).  Test Results: 02/06/2021 FINAL MICROSCOPIC DIAGNOSIS:   A. LUNG, RUL, BRUSHING:  - No malignant cells identified   B. LUNG, RUL, FINE NEEDLE ASPIRATION:  - No malignant cells identified   Micro BAL 02/06/2021 20,000 COLONIES/mL Normal respiratory flora-no Staph aureus or Pseudomonas seen     02/06/2021 Aerobic Anaerobic Culture STREPTOCOCCUS MITIS/ORALIS  NO ANAEROBES ISOLATED  Performed at [facility] [address] [place], [place] [address]     Report Status 02/11/2021 FINAL   Organism ID, Bacteria STREPTOCOCCUS MITIS/ORALIS   Resulting Agency [facility]     Susceptibility   Streptococcus mitis/oralis    MIC    CLINDAMYCIN <=0.25 SENS... Sensitive    TETRACYCLINE >=16 RESIST... Resistant    VANCOMYCIN <=0.12 SENS... Sensitive          02/06/21 Fungal Cx is  pending  AFB Negative   CBC Latest Ref Rng & Units 02/06/2021 07/04/2017 02/14/2016  WBC 4.0 - 10.5 K/uL 6.5 7.2 6.4  Hemoglobin 12.0 - 15.0 g/dL 16.7(H) 15.4 15.2  Hematocrit 36.0 - 46.0 % 48.6(H) 45.0 46.6(H)  Platelets 150 - 400 K/uL 245 276 271    BMP Latest Ref Rng & Units 07/04/2017 02/14/2016  Glucose 65 - 99 mg/dL 97 98  BUN 7 - 25 mg/dL 16 13  Creatinine 0.50 - 1.05 mg/dL 0.70 0.71  BUN/Creat Ratio 6 - 22 (calc) NOT APPLICABLE -  Sodium 700 - 146 mmol/L 140 142  Potassium 3.5 - 5.3 mmol/L 4.3 4.3  Chloride 98 - 110 mmol/L 105 104  CO2 20 - 32 mmol/L 27 28  Calcium 8.6 - 10.4 mg/dL 9.7 9.7    BNP No results found for: BNP  ProBNP No results found for: PROBNP  PFT No results found for: FEV1PRE, FEV1POST, FVCPRE, FVCPOST, TLC, DLCOUNC, PREFEV1FVCRT, PSTFEV1FVCRT  DG Chest Port 1 View  Result Date: 02/06/2021 CLINICAL DATA:  Post bronchoscopy and biopsy. EXAM: PORTABLE CHEST 1 VIEW COMPARISON:  Chest x-ray from March of 2018 and CT of the chest from December of 2022. FINDINGS: Trachea midline. Cardiomediastinal contours and hilar structures are normal. Hazy interstitial opacity at the periphery of the RIGHT upper lobe surrounding the area of the RIGHT apical nodule seen on recent chest CT and at the periphery of the RIGHT upper lobe along the minor fissure.  No pneumothorax. No lobar consolidation. On limited assessment there is no acute skeletal process. IMPRESSION: Hazy opacities surrounding nodular density in the RIGHT upper lobe and at the periphery of the RIGHT upper lobe along the minor fissure. Findings may reflect post biopsy changes, changes related to lavage or small amounts of hemorrhage. No lobar consolidation. No pneumothorax. Electronically Signed   By: Zetta Bills M.D.   On: 02/06/2021 13:27   CT Super D Chest Wo Contrast  Result Date: 02/02/2021 CLINICAL DATA:  Right upper lobe pulmonary nodule. EXAM: CT CHEST WITHOUT CONTRAST TECHNIQUE: Multidetector CT  imaging of the chest was performed using thin slice collimation for electromagnetic bronchoscopy planning purposes, without intravenous contrast. COMPARISON:  01/12/2021 FINDINGS: Cardiovascular: The heart size is normal. No substantial pericardial effusion. Coronary artery calcification is evident. Mild atherosclerotic calcification is noted in the wall of the thoracic aorta. Mediastinum/Nodes: No mediastinal lymphadenopathy. No evidence for gross hilar lymphadenopathy although assessment is limited by the lack of intravenous contrast on the current study. The esophagus has normal imaging features. There is no axillary lymphadenopathy. Lungs/Pleura: Centrilobular and paraseptal emphysema evident. 6 mm spiculated nodule identified central right upper lobe on image 39/3, corresponding to the 7 mm nodule seen on the previous study. 6 mm peripheral right middle lobe nodule on 88/3 is also similar to prior. 4 mm right lower lobe nodule again identified on image 119/3. 3 mm left lower lobe nodule on 66/3 is unchanged. Stable 5 mm paraspinal left lower lobe nodule on 65/3. No new suspicious nodule or mass. No focal airspace consolidation. No pleural effusion. Upper Abdomen: Unremarkable. Musculoskeletal: No worrisome lytic or sclerotic osseous abnormality. IMPRESSION: 1. 6 mm spiculated central right upper lobe pulmonary nodule, similar to the 7 mm nodule seen on the previous study. 2. Other scattered bilateral pulmonary nodules are unchanged in the interval. 3. Aortic Atherosclerosis (ICD10-I70.0) and Emphysema (ICD10-J43.9). Electronically Signed   By: Misty Stanley M.D.   On: 02/02/2021 12:02   DG C-ARM BRONCHOSCOPY  Result Date: 02/06/2021 C-ARM BRONCHOSCOPY: Fluoroscopy was utilized by the requesting physician.  No radiographic interpretation.     Past medical hx Past Medical History:  Diagnosis Date   COPD (chronic obstructive pulmonary disease) ([place])    Dyspnea    occ with exertion   HLD  (hyperlipidemia)    Lung nodule    Right upper lobe   Seasonal allergies    Tobacco use      Social History   Tobacco Use   Smoking status: Every Day    Packs/day: 1.50    Years: 40.00    Pack years: 60.00    Types: Cigarettes    Start date: 1984   Smokeless tobacco: Never   Tobacco comments:    Smokes 3-4cigs a day as of 02/14/21 ep  Vaping Use   Vaping Use: Never used  Substance Use Topics   Alcohol use: No   Drug use: No    Ms.Boline reports that she has been smoking cigarettes. She started smoking about 39 years ago. She has a 60.00 pack-year smoking history. She has never used smokeless tobacco. She reports that she does not drink alcohol and does not use drugs.  Tobacco Cessation: Current Every Day smoker  Counseled to quit She has a quitting plan  Past surgical hx, Family hx, Social hx all reviewed.  Current Outpatient Medications on File Prior to Visit  Medication Sig   albuterol (VENTOLIN HFA) 108 (90 Base) MCG/ACT inhaler Inhale 1 puff into the lungs  every 6 (six) hours as needed for wheezing or shortness of breath.   cetirizine (ZYRTEC) 10 MG tablet Take 10 mg by mouth See admin instructions. Pt alternates taking plain zyrtec and zyrtec d daily. If coughing badly with the plain zyrtec she'll switch to the zyrtec d daily for several days until she starts coughing again then will switch back to the plain zyrtec daily   cetirizine-pseudoephedrine (ZYRTEC-D) 5-120 MG tablet Take 1 tablet by mouth See admin instructions. Alternates taking plain zyrtec and zyrtec d daily. If she starts coughing badly with the plain zyrtec she'll switch to the zyrtec d daily for several days until she starts coughing again then will switch back to the plain zyrtec daily   rosuvastatin (CRESTOR) 20 MG tablet Take 20 mg by mouth at bedtime.   SPIRIVA HANDIHALER 18 MCG inhalation capsule Place 18 mcg into inhaler and inhale daily.   No current facility-administered medications on file prior  to visit.     Allergies  Allergen Reactions   Allegra [Fexofenadine] Nausea Only   Aspirin Other (See Comments)    Stomach pain   Atorvastatin     Body pain   Cinnamon Swelling    Tongue / mouth swelling   Mucinex [Guaifenesin Er] Diarrhea   Claritin [Loratadine] Rash   Tamiflu [Oseltamivir Phosphate] Rash    Review Of Systems:  Constitutional:   No  weight loss, night sweats,  Fevers, chills, fatigue, or  lassitude.  HEENT:   No headaches,  Difficulty swallowing,  Tooth/dental problems, or  Sore throat,                No sneezing, itching, ear ache, nasal congestion, post nasal drip,   CV:  No chest pain,  Orthopnea, PND, swelling in lower extremities, anasarca, dizziness, palpitations, syncope.   GI  No heartburn, indigestion, abdominal pain, nausea, vomiting, diarrhea, change in bowel habits, loss of appetite, bloody stools.   Resp: No shortness of breath with exertion or at rest.  No excess mucus, no productive cough,  No non-productive cough,  No coughing up of blood.  No change in color of mucus.  No wheezing.  No chest wall deformity  Skin: no rash or lesions.  GU: no dysuria, change in color of urine, no urgency or frequency.  No flank pain, no hematuria   MS:  No joint pain or swelling.  No decreased range of motion.  No back pain.  Psych:  No change in mood or affect. No depression or anxiety.  No memory loss.   Vital Signs BP 118/64 (BP Location: Right Arm, Patient Position: Sitting, Cuff Size: Normal)    Pulse 99    Temp 98.1 F (36.7 C) (Oral)    Ht _0  (1.626 m)    Wt 132 lb 12.8 oz (60.2 kg)    SpO2 97% Comment: RA   BMI 22.80 kg/m    Physical Exam:  General- No distress,  A&Ox3, pleasant ENT: No sinus tenderness, TM clear, pale nasal mucosa, no oral exudate,no post nasal drip, no LAN Cardiac: S1, S2, regular rate and rhythm, no murmur Chest: No wheeze/ rales/ dullness; no accessory muscle use, no nasal flaring, no sternal retractions Abd.: Soft  Non-tender, ND, BS +, Body mass index is 22.8 kg/m. Ext: No clubbing cyanosis, edema Neuro:  normal strength, MAE x 4, A&O x 3 Skin: No rashes, warm and dry, dry and intact Psych: normal mood and behavior   Assessment/Plan  ROBOTIC ASSISTED NAVIGATIONAL BRONCHOSCOPY for tissue  sampling of new 8 mm macrolobulated spiculated right upper lobe pulmonary nodule. No malignant Cells found Did well after procedure Continues to smoke Plan Your biopsy did not show malignant cells.  This is good news, but we need to continue to follow this nodule. We will plan on a 6 month follow up low dose CT chest through the screening program.  This will be due June of 2023.  We will call you to get this scheduled.  There was a bacteria in one of the cultures Dr. Valeta Harms obtained.  We will treat this with Clindamycin 150 mg  every 6 hours for 7 days/ Take this antibiotic with a Probiotic ( Culturelle or Activia Yogurt daily)  Follow up in 6 months after CT chest is done . Please work on quitting smoking completely.  Call if you need [place] sooner.  Please contact office for sooner follow up if symptoms do not improve or worsen or seek emergency care  Call 1-800 QUIT NOW for free nicotine patches gum or mints.   STREPTOCOCCUS MITIS/ORALIS per anaerobic/ aerobic cultures Sensitive to Clindamycin There was a bacteria in one of the cultures Dr. Valeta Harms obtained.  Plan We will treat this with Clindamycin 150 mg  every 6 hours for 7 days/ Take this antibiotic with a Probiotic ( Culturelle or Activia Yogurt daily)    I spent 40 minutes dedicated to the care of this patient on the date of this encounter to include pre-visit review of records, face-to-face time with the patient discussing conditions above, post visit ordering of testing, clinical documentation with the electronic health record, making appropriate referrals as documented, and communicating necessary information to the patient's healthcare team.     Magdalen Spatz, NP 02/14/2021  3:11 PM

## 2021-02-23 ENCOUNTER — Other Ambulatory Visit: Payer: Self-pay | Admitting: Acute Care

## 2021-02-23 DIAGNOSIS — R911 Solitary pulmonary nodule: Secondary | ICD-10-CM

## 2021-02-23 NOTE — Progress Notes (Signed)
6 month folow up low dose CT Chest ordered

## 2021-03-07 LAB — FUNGUS CULTURE WITH STAIN

## 2021-03-07 LAB — FUNGUS CULTURE RESULT

## 2021-03-07 LAB — FUNGAL ORGANISM REFLEX

## 2021-03-29 LAB — ACID FAST CULTURE WITH REFLEXED SENSITIVITIES (MYCOBACTERIA): Acid Fast Culture: NEGATIVE

## 2021-08-20 ENCOUNTER — Ambulatory Visit
Admission: RE | Admit: 2021-08-20 | Discharge: 2021-08-20 | Disposition: A | Discharge status: Home / Self Care | Payer: No Typology Code available for payment source | Source: Ambulatory Visit | Attending: Acute Care | Admitting: Acute Care

## 2021-08-20 DIAGNOSIS — R911 Solitary pulmonary nodule: Secondary | ICD-10-CM

## 2021-11-02 ENCOUNTER — Ambulatory Visit: Payer: No Typology Code available for payment source | Admitting: Acute Care

## 2021-11-06 ENCOUNTER — Encounter: Payer: Self-pay | Admitting: Primary Care

## 2021-11-06 ENCOUNTER — Ambulatory Visit: Payer: No Typology Code available for payment source | Admitting: Primary Care

## 2021-11-06 ENCOUNTER — Telehealth: Payer: Self-pay | Admitting: Primary Care

## 2021-11-06 DIAGNOSIS — R911 Solitary pulmonary nodule: Secondary | ICD-10-CM | POA: Diagnosis not present

## 2021-11-06 DIAGNOSIS — J449 Chronic obstructive pulmonary disease, unspecified: Secondary | ICD-10-CM

## 2021-11-06 DIAGNOSIS — Z122 Encounter for screening for malignant neoplasm of respiratory organs: Secondary | ICD-10-CM

## 2021-11-06 DIAGNOSIS — F17200 Nicotine dependence, unspecified, uncomplicated: Secondary | ICD-10-CM | POA: Insufficient documentation

## 2021-11-06 DIAGNOSIS — Z87891 Personal history of nicotine dependence: Secondary | ICD-10-CM

## 2021-11-06 DIAGNOSIS — J302 Other seasonal allergic rhinitis: Secondary | ICD-10-CM

## 2021-11-06 DIAGNOSIS — F17210 Nicotine dependence, cigarettes, uncomplicated: Secondary | ICD-10-CM

## 2021-11-06 HISTORY — DX: Nicotine dependence, unspecified, uncomplicated: F17.200

## 2021-11-06 MED ORDER — ALBUTEROL SULFATE HFA 108 (90 BASE) MCG/ACT IN AERS: 1.0000 | INHALATION_SPRAY | Freq: Four times a day (QID) | RESPIRATORY_TRACT | 3 refills | Status: AC | PRN

## 2021-11-06 MED ORDER — LEVOCETIRIZINE DIHYDROCHLORIDE 5 MG PO TABS: 5.0000 mg | ORAL_TABLET | Freq: Every evening | ORAL | 3 refills | Status: DC

## 2021-11-06 NOTE — Assessment & Plan Note (Addendum)
-   Stable; She has mild dyspnea symptoms with exertion. She developed productive cough 3 days ago. No purulent mucus. She has associated PND. She is compliant with Spiriva HandiHaler 18 mcg 2 puffs daily.  Uses Saba on average 3 times a week. Lungs were clear on exam. Recommend she take Robitussin every 4-6 hours as needed for cough.  Main focus needs to be smoking cessation.  Advised patient notify office if cough persists or becomes purulent.

## 2021-11-06 NOTE — Patient Instructions (Addendum)
Recommendations Continue Spiriva HandiHaler 18 mcg- take 1 capsule daily or two puffs  Use albuterol rescue inhaler 1 to 2 puffs every 6 hours as needed for breakthrough shortness of breath or wheezing Stop Zyrtec Start antihistamine called Xyzal 5mg  at bedtime  Take Robitussin cough syrup over the counter as needed for cough Strongly encourage you quit smoking, taper and pick quit date We will get you back established with lung cancer screening program, you will be due for low-dose CAT scan in July 2024 Call if cough becomes purulent  Follow-up 6 months with Dr. Valeta Harms

## 2021-11-06 NOTE — Assessment & Plan Note (Addendum)
-   Current smoker. She previously quit for 6 months.  Smoking cessation strongly encouraged, advised she taper amount and pick quit date.

## 2021-11-06 NOTE — Assessment & Plan Note (Addendum)
-   LDCT on 01/12/22 with lung cancer screening program showed new 81mm spiculated RUL pulmonary nodule. She underwent navigational bronchoscopy with Dr. Valeta Harms on 02/06/2021 for tissue sampling that was negative for malignant cells.  Culture grew out streptococcus mitis/oralis, treated with course of the clindamycin.  Follow-up CT imaging in July 2023 showed stable bilateral pulmonary nodules, right upper lobe nodule remaining stable at 6 mm.  She will resume screening with lung cancer screening program in July 2024.  Smoking cessation strongly encouraged.

## 2021-11-06 NOTE — Assessment & Plan Note (Signed)
-   Changing antihistamine from Zyrtec to Xyzal 5 mg at bedtime

## 2021-11-06 NOTE — Telephone Encounter (Signed)
Needs to resume with lung cancer screening program. She had CT chest in July 2023 that showed stable nodules.

## 2021-11-06 NOTE — Progress Notes (Signed)
_0  ID: Sierra Snyder, female    DOB: Apr 22, 1960, 61 y.o.   MRN: [id number]  Chief Complaint  Patient presents with   Follow-up    Referring provider: Emeterio Reeve, DO  HPI: 61 y.o. female current everyday smoker followed through the lung cancer screening program. She had an abnormal screening scan and was found to have a new 8 mm macrolobulated spiculated right upper lobe pulmonary nodule.She had a ROBOTIC ASSISTED NAVIGATIONAL BRONCHOSCOPY by Dr. Valeta Harms on 02/06/2021 for tissue sampling.   Previous [facility] pulmonary encounter:  02/14/2021 Pt. Presents for follow up. Biopsy was negative for malignant cells. She states she did well after her procedure. Minimal blood post procedure. She did have a sore throat for 3 days, but this seld resolved.  She is working on quitting smoking. She has patches from her PCP, and I have also provided her with the 1-800-QUIT NOW number for free patches , gum or mints.  We discussed her cytology was negative for malignant cells. We will do follow up imaging in 6 months to re-evaluate the nodules of concern. She is in agreement with this plan. I will check with Dr. Valeta Harms regarding a Super D follow up vs PET, vs a Low Dose CT through the screening program. ( Goal to return her to the screening program ).  Test Results: 02/06/2021 FINAL MICROSCOPIC DIAGNOSIS:   A. LUNG, RUL, BRUSHING:  - No malignant cells identified   B. LUNG, RUL, FINE NEEDLE ASPIRATION:  - No malignant cells identified   Micro BAL 02/06/2021 20,000 COLONIES/mL Normal respiratory flora-no Staph aureus or Pseudomonas seen    11/06/2021- Interim hx  Patient presents today for follow-up. Followed by lung cancer screening. She had a robotic assisted navigation bronchoscopy by Dr. Valeta Harms on 02/06/2021 for tissue sampling new 90m spiculated RUL pulmonary nodule. No malignant cells. Aerobic anaerobic culture grew out streptococcus mitis/oralis, treated with course of clindamycin. Follow-up  CT chest in 6 months. Smoking cessation encouraged.  She had follow up CT chest in July 2023 that showed similar bilateral pulmonary nodules, nodule of interest within RUL remains stable measuring 667m No acute process in chest. She developed cough 3 days ago which is slightly productive without purulence. Associated nasal drainage. She alternates zyrtec and zyrtec -D. She has never been able to tolerate nasal sprays. She is still smoking 1/2 a pack daily, she had quit for 6 months but restarted. She is compliant with Spiriva handihaler. Uses SABA on average 3 times a week but can go some weeks without needing it.    Allergies  Allergen Reactions   Allegra [Fexofenadine] Nausea Only   Aspirin Other (See Comments)    Stomach pain   Atorvastatin     Body pain   Cinnamon Swelling    Tongue / mouth swelling   Mucinex [Guaifenesin Er] Diarrhea   Claritin [Loratadine] Rash   Tamiflu [Oseltamivir Phosphate] Rash    Immunization History  Administered Date(s) Administered   PFIZER(Purple Top)SARS-COV-2 Vaccination 09/10/2019, 10/01/2019   Tdap 02/14/2016    Past Medical History:  Diagnosis Date   COPD (chronic obstructive pulmonary disease) ([facility])    Dyspnea    occ with exertion   HLD (hyperlipidemia)    Lung nodule    Right upper lobe   Seasonal allergies    Tobacco use     Tobacco History: Social History   Tobacco Use  Smoking Status Every Day   Packs/day: 0.50   Years: 40.00   Total pack years: 20.00  Types: Cigarettes   Start date: 1984   Passive exposure: Past  Smokeless Tobacco Never  Tobacco Comments   Smokes about 1/2 pack per day 11/06/21 PAP   Ready to quit: Not Answered Counseling given: Not Answered Tobacco comments: Smokes about 1/2 pack per day 11/06/21 PAP   Outpatient Medications Prior to Visit  Medication Sig Dispense Refill   meloxicam (MOBIC) 15 MG tablet Take 15 mg by mouth daily.     rosuvastatin (CRESTOR) 40 MG tablet Take 40 mg by mouth daily.      SPIRIVA HANDIHALER 18 MCG inhalation capsule Place 18 mcg into inhaler and inhale daily.     albuterol (VENTOLIN HFA) 108 (90 Base) MCG/ACT inhaler Inhale 1 puff into the lungs every 6 (six) hours as needed for wheezing or shortness of breath.     cetirizine (ZYRTEC) 10 MG tablet Take 10 mg by mouth See admin instructions. Pt alternates taking plain zyrtec and zyrtec d daily. If coughing badly with the plain zyrtec she'll switch to the zyrtec d daily for several days until she starts coughing again then will switch back to the plain zyrtec daily     cetirizine-pseudoephedrine (ZYRTEC-D) 5-120 MG tablet Take 1 tablet by mouth See admin instructions. Alternates taking plain zyrtec and zyrtec d daily. If she starts coughing badly with the plain zyrtec she'll switch to the zyrtec d daily for several days until she starts coughing again then will switch back to the plain zyrtec daily     rosuvastatin (CRESTOR) 20 MG tablet Take 20 mg by mouth at bedtime. (Patient not taking: Reported on 11/06/2021)     No facility-administered medications prior to visit.   Review of Systems  Review of Systems  Constitutional: Negative.   HENT:  Positive for postnasal drip.   Respiratory:  Positive for cough. Negative for chest tightness, shortness of breath and wheezing.        DOE   Physical Exam  BP 112/78 (BP Location: Left Arm, Patient Position: Sitting, Cuff Size: Small)   Pulse 82   Temp 98 F (36.7 C) (Oral)   Ht _0  (1.626 m)   Wt 129 lb 12.8 oz (58.9 kg)   SpO2 97%   BMI 22.28 kg/m  Physical Exam Constitutional:      Appearance: Normal appearance. She is not ill-appearing.  HENT:     Head: Normocephalic and atraumatic.     Mouth/Throat:     Pharynx: Oropharynx is clear.  Cardiovascular:     Rate and Rhythm: Normal rate and regular rhythm.  Pulmonary:     Effort: Pulmonary effort is normal.     Breath sounds: Normal breath sounds. No wheezing, rhonchi or rales.     Comments:  CTA Musculoskeletal:        General: Normal range of motion.  Skin:    General: Skin is warm and dry.  Neurological:     General: No focal deficit present.     Mental Status: She is alert and oriented to person, place, and time. Mental status is at baseline.  Psychiatric:        Mood and Affect: Mood normal.        Behavior: Behavior normal.        Thought Content: Thought content normal.        Judgment: Judgment normal.      Lab Results:  CBC    Component Value Date/Time   WBC 6.5 02/06/2021 0945   RBC 5.38 (H) 02/06/2021 0945  HGB 16.7 (H) 02/06/2021 0945   HCT 48.6 (H) 02/06/2021 0945   PLT 245 02/06/2021 0945   MCV 90.3 02/06/2021 0945   MCH 31.0 02/06/2021 0945   MCHC 34.4 02/06/2021 0945   RDW 13.4 02/06/2021 0945   LYMPHSABS 2,944 02/14/2016 0923   MONOABS 448 02/14/2016 0923   EOSABS 192 02/14/2016 0923   BASOSABS 0 02/14/2016 0923    BMET    Component Value Date/Time   NA 140 07/04/2017 0957   K 4.3 07/04/2017 0957   CL 105 07/04/2017 0957   CO2 27 07/04/2017 0957   GLUCOSE 97 07/04/2017 0957   BUN 16 07/04/2017 0957   CREATININE 0.70 07/04/2017 0957   CALCIUM 9.7 07/04/2017 0957   GFRNONAA 98 07/04/2017 0957   GFRAA 113 07/04/2017 0957    BNP No results found for: "BNP"  ProBNP No results found for: "PROBNP"  Imaging: No results found.   Assessment & Plan:   Right upper lobe pulmonary nodule - LDCT on 01/12/22 with lung cancer screening program showed new 26m spiculated RUL pulmonary nodule. She underwent navigational bronchoscopy with Dr. IValeta Harmson 02/06/2021 for tissue sampling that was negative for malignant cells.  Culture grew out streptococcus mitis/oralis, treated with course of the clindamycin.  Follow-up CT imaging in July 2023 showed stable bilateral pulmonary nodules, right upper lobe nodule remaining stable at 6 mm.  She will resume screening with lung cancer screening program in July 2024.  Smoking cessation strongly  encouraged.  Stage 3 severe COPD by GOLD classification ([place] - Stable; She has mild dyspnea symptoms with exertion. She developed productive cough 3 days ago. No purulent mucus. She has associated PND. She is compliant with Spiriva HandiHaler 18 mcg 2 puffs daily.  Uses Saba on average 3 times a week. Lungs were clear on exam. Recommend she take Robitussin every 4-6 hours as needed for cough.  Main focus needs to be smoking cessation.  Advised patient notify office if cough persists or becomes purulent.  Seasonal allergies - Changing antihistamine from Zyrtec to Xyzal 5 mg at bedtime  Current smoker - Current smoker. She previously quit for 6 months.  Smoking cessation strongly encouraged, advised she taper amount and pick quit date.    EMartyn Ehrich NP 11/06/2021

## 2021-11-07 NOTE — Telephone Encounter (Signed)
New CT order placed to restart lung screening program 08/2022.

## 2021-11-13 HISTORY — PX: BUNIONECTOMY: SHX129

## 2022-08-22 ENCOUNTER — Other Ambulatory Visit: Payer: No Typology Code available for payment source

## 2022-09-09 ENCOUNTER — Other Ambulatory Visit: Payer: No Typology Code available for payment source

## 2022-09-13 ENCOUNTER — Ambulatory Visit
Admission: RE | Admit: 2022-09-13 | Discharge: 2022-09-13 | Disposition: A | Discharge status: Home / Self Care | Payer: 59 | Source: Ambulatory Visit | Attending: Acute Care | Admitting: Acute Care

## 2022-09-13 DIAGNOSIS — F17210 Nicotine dependence, cigarettes, uncomplicated: Secondary | ICD-10-CM

## 2022-09-13 DIAGNOSIS — Z122 Encounter for screening for malignant neoplasm of respiratory organs: Secondary | ICD-10-CM

## 2022-09-13 DIAGNOSIS — Z87891 Personal history of nicotine dependence: Secondary | ICD-10-CM

## 2022-09-20 ENCOUNTER — Other Ambulatory Visit: Payer: Self-pay | Admitting: Acute Care

## 2022-09-20 DIAGNOSIS — Z87891 Personal history of nicotine dependence: Secondary | ICD-10-CM

## 2022-09-20 DIAGNOSIS — Z122 Encounter for screening for malignant neoplasm of respiratory organs: Secondary | ICD-10-CM

## 2022-09-20 DIAGNOSIS — F17210 Nicotine dependence, cigarettes, uncomplicated: Secondary | ICD-10-CM

## 2022-10-05 IMAGING — CT CT CHEST LCS NODULE FOLLOW-UP W/O CM
2 of 5 series · 15 of 40 positions shown, 18 images · non-contrast
Comparison: 06/06/2020.

CLINICAL DATA: Current smoker, 56 pack-year history.

EXAM:
CT CHEST WITHOUT CONTRAST FOR LUNG CANCER SCREENING NODULE FOLLOW-UP
TECHNIQUE: Multidetector CT imaging of the chest was performed following the
standard protocol without IV contrast.

[Series 4: lcs f/u 1.00 br60 s3 axial lung · axial · 0.68mm/px · z∈[-1153,-853]mm · 12 of 332 slices shown, 15 images]
[im 16/332  mediastinal]
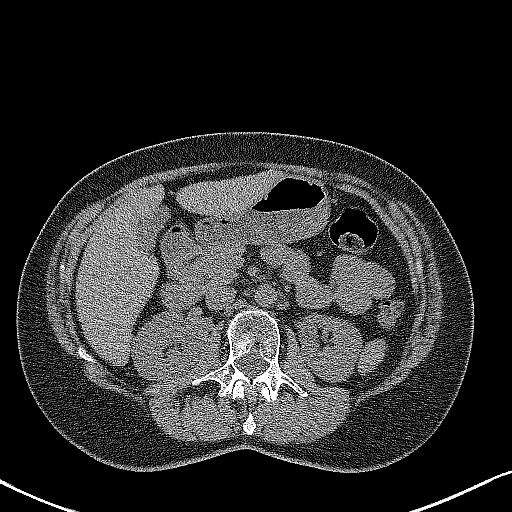
[im 16/332  lung]
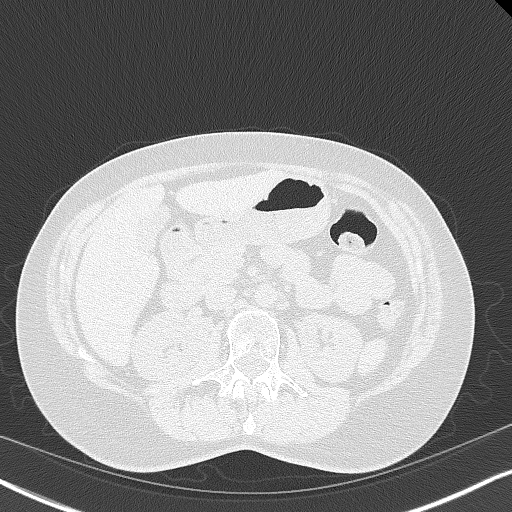
[im 46/332  lung]
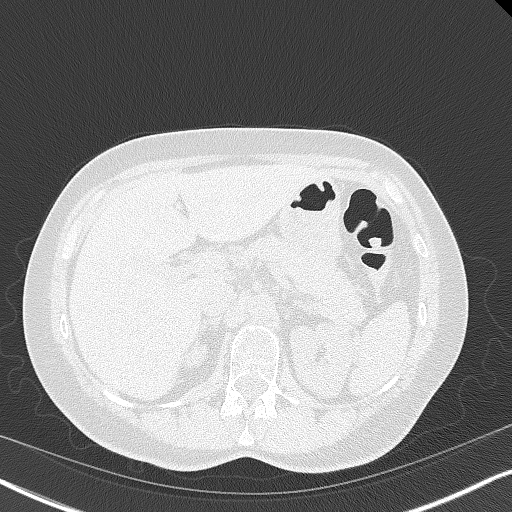
[im 76/332  lung]
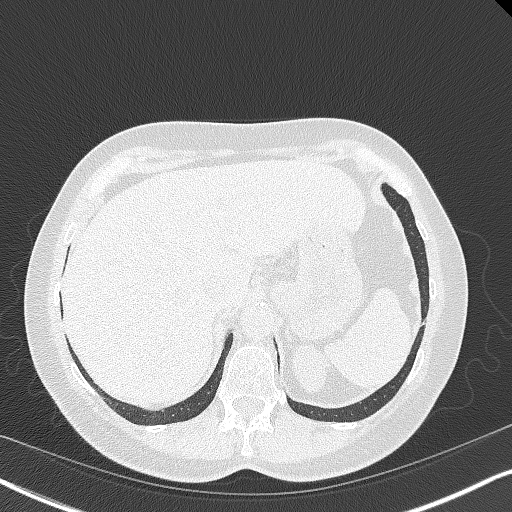
[im 106/332  lung]
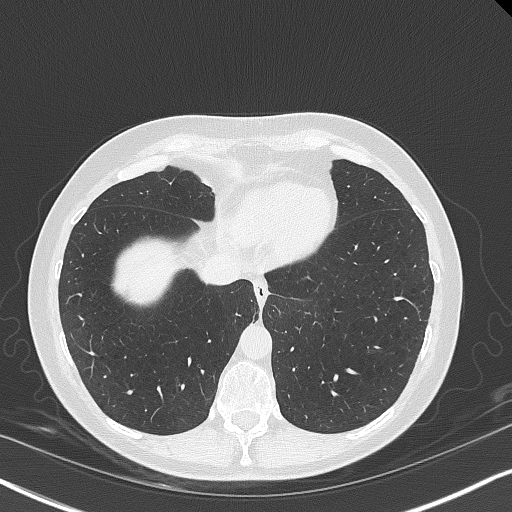
[im 121/332  mediastinal]
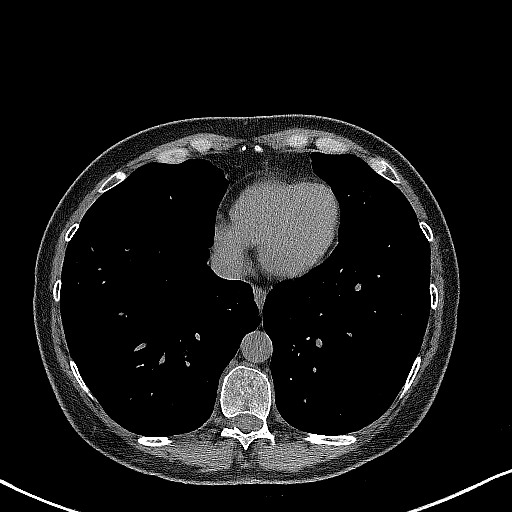
[im 121/332  lung]
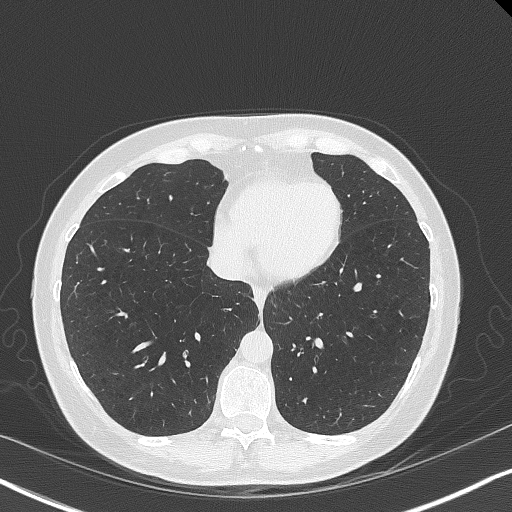
[im 151/332  lung]
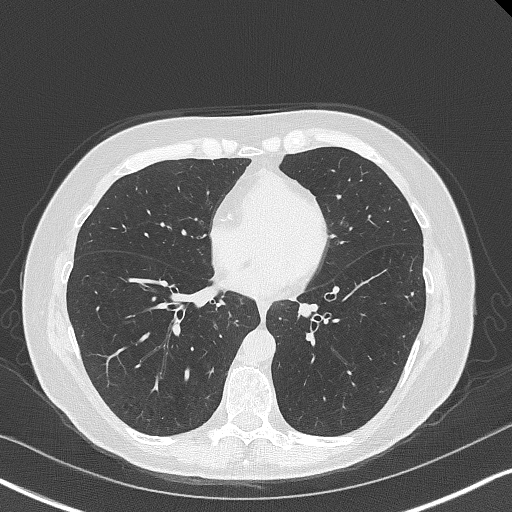
[im 181/332  lung]
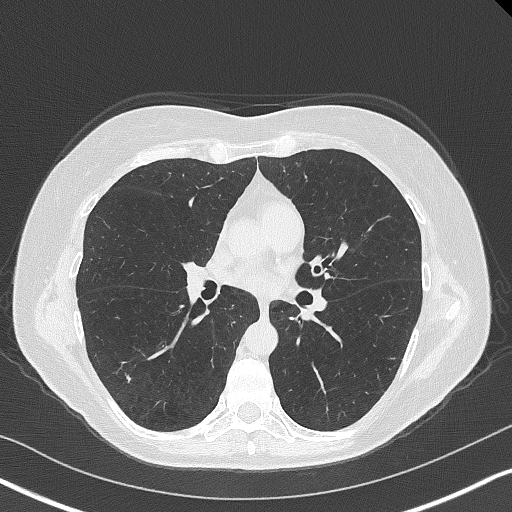
[im 211/332  lung]
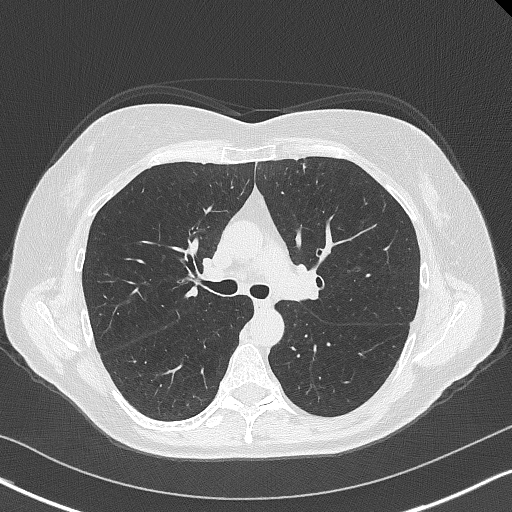
[im 226/332  mediastinal]
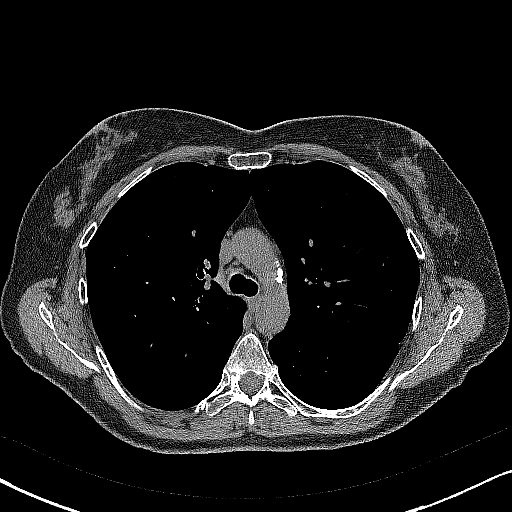
[im 226/332  lung]
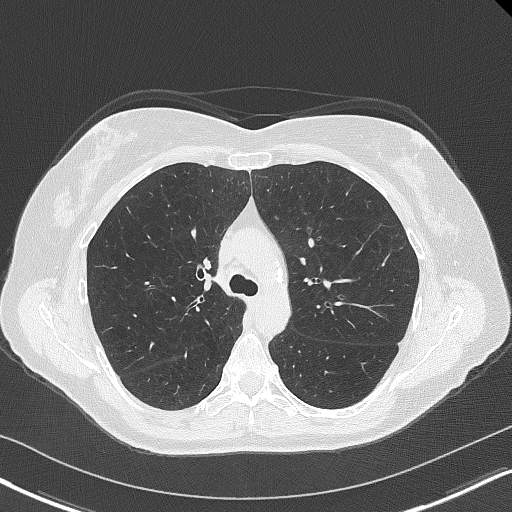
[im 256/332  lung]
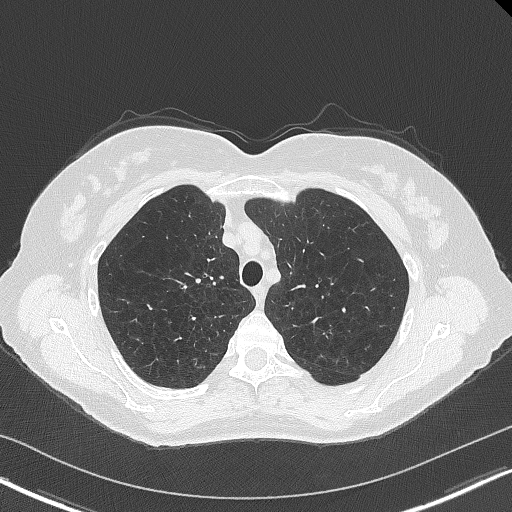
[im 286/332  lung]
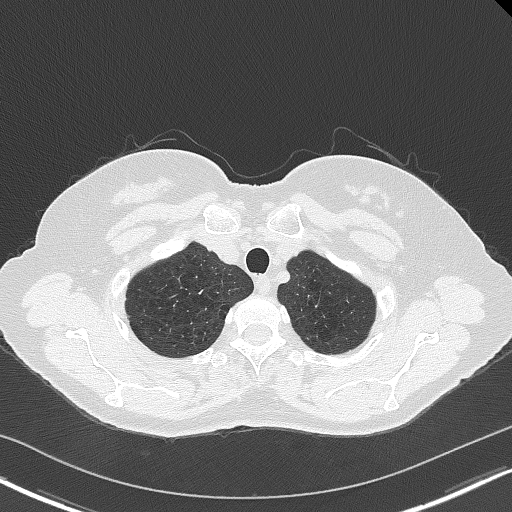
[im 316/332  lung]
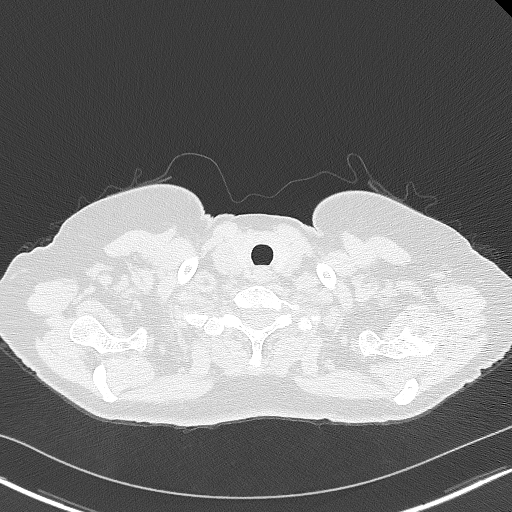

[Series 5: lcs f/u 1.00 br44 s3 cor · coronal · 0.65mm/px · 3 of 291 slices shown]
[im 59/291  lung]
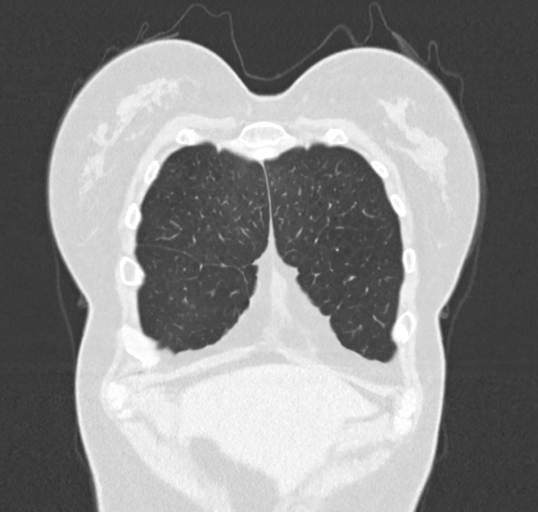
[im 117/291  lung]
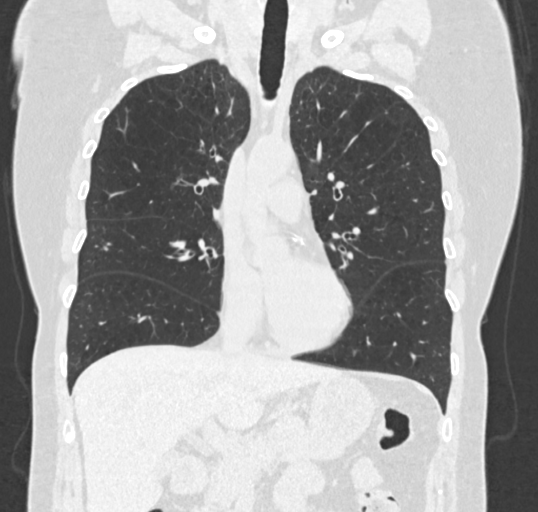
[im 175/291  lung]
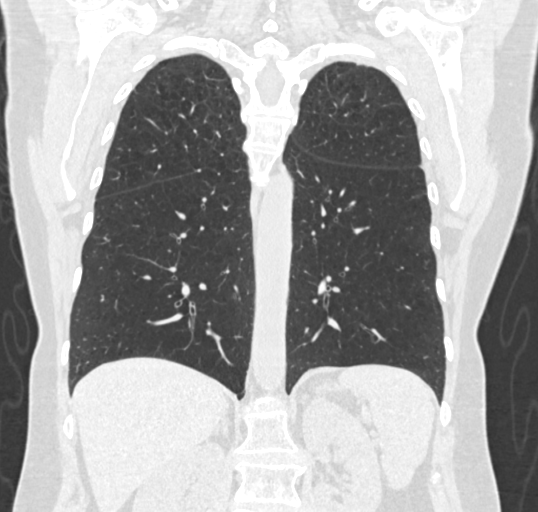

[15 of 40 positions shown; findings below may reference images not displayed]

FINDINGS: Cardiovascular: Atherosclerotic calcification of the aorta and
coronary arteries. Heart size normal. No pericardial effusion.

Mediastinum/Nodes: No pathologically enlarged mediastinal or
axillary lymph nodes. Hilar regions are difficult to evaluate
without IV contrast but appear grossly unremarkable. Esophagus is
grossly unremarkable.

Lungs/Pleura: Centrilobular emphysema. There are a few new pulmonary
nodules, including a 7.2 mm spiculated nodule in the apical segment
right upper lobe (4/69). No pleural fluid. Airway is unremarkable.

Upper Abdomen: Visualized portions of the liver, gallbladder,
adrenal glands, kidneys, spleen, pancreas, stomach and bowel are
grossly unremarkable.

Musculoskeletal: Mild degenerative changes in the spine. No
worrisome lytic or sclerotic lesions.
IMPRESSION: 1. Lung-RADS 4A, suspicious. Follow up low-dose chest CT without
contrast in 3 months (please use the following order, "CT CHEST LCS
NODULE FOLLOW-UP W/O CM") is recommended. Alternatively, PET may be
considered when there is a solid component 8mm or larger. New
pulmonary nodules measure up to 7.2 mm in the apical segment right
upper lobe. These results will be called to the ordering clinician
or representative by the Radiologist Assistant, and communication
documented in the PACS or [REDACTED].
2. Aortic atherosclerosis (DDG6R-O9V.V). Coronary artery
calcification.
3.  Emphysema (DDG6R-N40.R).

## 2022-11-08 ENCOUNTER — Other Ambulatory Visit: Payer: Self-pay

## 2022-11-08 DIAGNOSIS — R0609 Other forms of dyspnea: Secondary | ICD-10-CM | POA: Insufficient documentation

## 2022-11-08 DIAGNOSIS — E782 Mixed hyperlipidemia: Secondary | ICD-10-CM | POA: Insufficient documentation

## 2022-11-08 DIAGNOSIS — R0600 Dyspnea, unspecified: Secondary | ICD-10-CM | POA: Insufficient documentation

## 2022-11-08 DIAGNOSIS — Z720 Tobacco use: Secondary | ICD-10-CM | POA: Insufficient documentation

## 2022-11-08 DIAGNOSIS — E785 Hyperlipidemia, unspecified: Secondary | ICD-10-CM | POA: Insufficient documentation

## 2022-11-08 DIAGNOSIS — R911 Solitary pulmonary nodule: Secondary | ICD-10-CM | POA: Insufficient documentation

## 2022-11-08 DIAGNOSIS — I2510 Atherosclerotic heart disease of native coronary artery without angina pectoris: Secondary | ICD-10-CM | POA: Insufficient documentation

## 2022-11-08 DIAGNOSIS — J449 Chronic obstructive pulmonary disease, unspecified: Secondary | ICD-10-CM | POA: Insufficient documentation

## 2022-11-08 DIAGNOSIS — Z789 Other specified health status: Secondary | ICD-10-CM | POA: Insufficient documentation

## 2022-11-11 ENCOUNTER — Encounter: Payer: Self-pay | Admitting: *Deleted

## 2022-11-12 ENCOUNTER — Ambulatory Visit: Payer: 59

## 2022-11-12 VITALS — BP 120/78 | HR 82 | Ht 64.0 in | Wt 120.8 lb

## 2022-11-12 DIAGNOSIS — R0609 Other forms of dyspnea: Secondary | ICD-10-CM

## 2022-11-12 DIAGNOSIS — I25118 Atherosclerotic heart disease of native coronary artery with other forms of angina pectoris: Secondary | ICD-10-CM

## 2022-11-12 DIAGNOSIS — E782 Mixed hyperlipidemia: Secondary | ICD-10-CM | POA: Diagnosis not present

## 2022-11-12 MED ORDER — METOPROLOL TARTRATE 100 MG PO TABS: 100.0000 mg | ORAL_TABLET | Freq: Once | ORAL | 0 refills | Status: DC

## 2022-11-12 MED ORDER — ROSUVASTATIN CALCIUM 20 MG PO TABS: 20.0000 mg | ORAL_TABLET | Freq: Every day | ORAL | 3 refills | Status: DC

## 2022-11-12 MED ORDER — PREDNISONE 50 MG PO TABS: ORAL_TABLET | ORAL | 0 refills | Status: DC

## 2022-11-12 NOTE — Assessment & Plan Note (Signed)
Observed on low-dose CT scan done for lung cancer screening. She does have risk factors in the form of age, hyperlipidemia, smoking history. She does have ongoing progressive symptoms of dyspnea on exertion.  Reviewed further evaluation for obstructive coronary artery disease with functional assessment with stress test nuclear imaging versus anatomical assessment with CT coronary angiogram.  Given progressive symptoms, more data available for restratification, decided to proceed with CT coronary angiogram. She will get premedication with prednisone and Benadryl given her contrast dye allergy. She will also resume metoprolol tartrate 100 mg on the morning of the test to help optimize heart rates for the procedure.  We reviewed statin use in the setting to overall reduce the risk of MI and cardiovascular disease progression. Since she was able to tolerate rosuvastatin 20 mg in the past without any side effects, resume the dose.  Will send a prescription.

## 2022-11-12 NOTE — Assessment & Plan Note (Signed)
She was able to tolerate moderate dose of statins.  Higher doses caused her muscle aches. At this time she is in the process of obtaining PCSK9 inhibitor Repatha.  Her lipid panel recently from September 2024 was suboptimal with total cholesterol 259, HDL 54, LDL 179, non-HDL 205. I would recommend resuming rosuvastatin 20 mg once daily as above. If LDL remains elevated [target below 70 mg/dL], would recommend adding PCSK9 inhibitors.

## 2022-11-12 NOTE — Progress Notes (Signed)
Cardiology Consultation:    Date:  11/12/2022   ID:  Sierra Snyder, DOB 12/22/1960, MRN [id number]  PCP:  Sierra Nielsen, DO  Cardiologist:  Marlyn Corporal Ithan Touhey, MD   Referring MD: Sierra Berger, MD   No chief complaint on file. Coronary atherosclerosis on low-dose CT scan of chest and dyspnea on exertion   ASSESSMENT AND PLAN:   [name] 62 year old woman Problem List Items Addressed This Visit     Coronary atherosclerosis - Primary    Observed on low-dose CT scan done for lung cancer screening. She does have risk factors in the form of age, hyperlipidemia, smoking history. She does have ongoing progressive symptoms of dyspnea on exertion.  Reviewed further evaluation for obstructive coronary artery disease with functional assessment with stress test nuclear imaging versus anatomical assessment with CT coronary angiogram.  Given progressive symptoms, more data available for restratification, decided to proceed with CT coronary angiogram. She will get premedication with prednisone and Benadryl given her contrast dye allergy. She will also resume metoprolol tartrate 100 mg on the morning of the test to help optimize heart rates for the procedure.  We reviewed statin use in the setting to overall reduce the risk of MI and cardiovascular disease progression. Since she was able to tolerate rosuvastatin 20 mg in the past without any side effects, resume the dose.  Will send a prescription.      Relevant Medications   metoprolol tartrate (LOPRESSOR) 100 MG tablet   rosuvastatin (CRESTOR) 20 MG tablet   predniSONE (DELTASONE) 50 MG tablet   Other Relevant Orders   EKG 12-Lead (Completed)   Basic metabolic panel   CT CORONARY MORPH W/CTA COR W/SCORE W/CA W/CM &/OR WO/CM   ECHOCARDIOGRAM COMPLETE   Dyspnea on exertion    Progressive more so over the past few months.  She does have COPD, seasonal allergies but symptoms seem to be well-regulated with albuterol use and daily  Zyrtec.  Her symptom progression is different in comparison to prior seasons and over the short.. In the setting of her COPD this could be a pulmonary issue but the nature of progression of symptoms is more concerning for an anginal equivalent.  Continue with assessment for obstructive coronary artery disease and for any underlying structural heart disease with echocardiogram transthoracic and CT coronary angiogram.  If there is any obstructive coronary artery disease we will proceed with further antiplatelet therapy initiation with Plavix 75 mg once daily given her allergy reported to aspirin.      Mixed hyperlipidemia    She was able to tolerate moderate dose of statins.  Higher doses caused her muscle aches. At this time she is in the process of obtaining PCSK9 inhibitor Repatha.  Her lipid panel recently from September 2024 was suboptimal with total cholesterol 259, HDL 54, LDL 179, non-HDL 205. I would recommend resuming rosuvastatin 20 mg once daily as above. If LDL remains elevated [target below 70 mg/dL], would recommend adding PCSK9 inhibitors.      Relevant Medications   metoprolol tartrate (LOPRESSOR) 100 MG tablet   rosuvastatin (CRESTOR) 20 MG tablet   Other Relevant Orders   Basic metabolic panel  Return to clinic based on test results.    History of Present Illness:    Sierra Snyder is a 62 y.o. female who is being seen today for the evaluation of coronary atherosclerosis observed on CT chest done for cancer screening and dyspnea on exertion progressive at the request of Sistasis, Lowell Guitar, MD. .  She  has history of smoking, COPD, hyperlipidemia with statin intolerance [muscle aches] reported at doses about 20 mg of rosuvastatin, contrast dye allergy, aspirin intolerance due to stomach pain and gastritis.  Very pleasant woman here for the visit by herself.  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  Lives with her husband at home, they  have a dog.  No regular exercise but she does keep herself active with regular walks for up to an hour with her dog, working in the yard and in the house.  She is able to do these activities without significant limitations.  Over the last couple months she has had noticed worsening shortness of breath with exertion.  Relieved with rest.  She also reports mild shortness of breath in the mornings when she wakes up that resolves after she gets her albuterol inhaler. Denies any dizziness, lightheadedness, syncopal episodes.  Denies any pedal edema.  Denies orthopnea.  She mentions having significantly altered her diet since retirement and has been eating healthy and was able to lose up to 8 pounds in the past 6 months.  She has been able to take her statin medications without any effects so long as the dose is below 20 mg/day.  Recently her rosuvastatin dose was bumped up to 40 mg and she started having muscle aches and hence discontinued.  She is in the process of obtaining Repatha. She is willing to try low-dose rosuvastatin again.  She continues to smoke up to 5 cigarettes a day. No alcohol use. No recreational drug use.  Father died from complications of bowel disease in his 89s. Mother had minor stroke in her 58s, currently in her 12s.  CT chest cancer screening reported three-vessel coronary atherosclerosis.  Normal caliber thoracic and pulmonary arteries.  Blood work from 10-30-2022 noted lipid panel total cholesterol 259, HDL 54, triglycerides 125, LDL 179, non-HDL 205.  Sodium 140, potassium 4.8, BUN 9, creatinine 0.75, normal transaminases Hemoglobin A1c 5.9 Hemoglobin 16.4, hematocrit 49.3, WBC 7.1, platelets 260    Past Medical History:  Diagnosis Date   Atherosclerotic heart disease of native coronary artery without angina pectoris    Community acquired pneumonia of right lung 04/15/2016   COPD (chronic obstructive pulmonary disease) (HCC)    Coronary arteriosclerosis     Coronary atherosclerosis 05/05/2019   Current smoker 11/06/2021   Dyspnea    occ with exertion   History of cardiovascular stress test 02/14/2016   normal per patient. 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  Navigational bronchoscopy on 02/06/2021 negative for malignant cells     Seasonal allergies    Stage 3 severe COPD by GOLD classification (HCC) 04/24/2016   Statin not tolerated    Tobacco use     Past Surgical History:  Procedure Laterality Date   ABDOMINAL HYSTERECTOMY     BRONCHIAL BIOPSY  02/06/2021   Procedure: BRONCHIAL BIOPSIES;  Surgeon: Josephine Igo, DO;  Location: [facility];  Service: Pulmonary;;   BRONCHIAL  BRUSHINGS  02/06/2021   Procedure: BRONCHIAL BRUSHINGS;  Surgeon: Josephine Igo, DO;  Location: [facility];  Service: Pulmonary;;   BRONCHIAL WASHINGS  02/06/2021   Procedure: BRONCHIAL WASHINGS;  Surgeon: Josephine Igo, DO;  Location: [facility];  Service: Pulmonary;;   BUNIONECTOMY Right 11/13/2021   HERNIA REPAIR     x 3   MULTIPLE TOOTH EXTRACTIONS     w/anesthesia   VIDEO BRONCHOSCOPY WITH RADIAL ENDOBRONCHIAL ULTRASOUND  02/06/2021   Procedure: RADIAL ENDOBRONCHIAL ULTRASOUND;  Surgeon: Josephine Igo, DO;  Location: [facility];  Service: Pulmonary;;    Current Medications: Current Meds  Medication Sig   albuterol (VENTOLIN HFA) 108 (90 Base) MCG/ACT inhaler Inhale 1 puff into the lungs every 6 (six) hours as needed for wheezing or shortness of breath.    Cetirizine-Pseudoephedrine (ZYRTEC-D PO) Take 1 tablet by mouth daily.   gabapentin (NEURONTIN) 100 MG capsule Take 100 mg by mouth at bedtime as needed (pain).   metoprolol tartrate (LOPRESSOR) 100 MG tablet Take 1 tablet (100 mg total) by mouth once for 1 dose. Take 2 hours prior to your CT if your heart rate is greater than 55   predniSONE (DELTASONE) 50 MG tablet Prednisone 50 mg - take 13 hours prior to test Take another Prednisone 50 mg 7 hours prior to test Take another Prednisone 50 mg 1 hour prior to test Take Benadryl 50 mg 1 hour prior to test Patient must complete all four doses of above prophylactic medications. Patient will need a ride after test due to Benadryl.   rosuvastatin (CRESTOR) 20 MG tablet Take 1 tablet (20 mg total) by mouth daily.   SPIRIVA RESPIMAT 1.25 MCG/ACT AERS Inhale 2 puffs into the lungs daily.     Allergies:   Allegra [fexofenadine], Aspirin, Atorvastatin, Cinnamon, Iodinated contrast media, Mucinex [guaifenesin er], Pravastatin, Rosuvastatin, Claritin [loratadine], and Tamiflu [oseltamivir phosphate]   Social History   Socioeconomic History   Marital status: Married    Spouse name: Not on file   Number of children: Not on file   Years of education: Not on file   Highest education level: Not on file  Occupational History   Not on file  Tobacco Use   Smoking status: Every Day    Current packs/day: 0.50    Average packs/day: 0.5 packs/day for 40.7 years (20.4 ttl pk-yrs)    Types: Cigarettes    Start date: 1984    Passive exposure: Past   Smokeless tobacco: Never   Tobacco comments:    Smokes about 1/2 pack per day 11/06/21 PAP  Vaping Use   Vaping status: Never Used  Substance and Sexual Activity   Alcohol use: No   Drug use: No   Sexual activity: Yes    Birth control/protection: Surgical    Comment: Hysterectomy  Other Topics Concern   Not on file  Social History Narrative   Not on file   Social Determinants of Health   Financial  Resource Strain: Not on file  Food Insecurity: Not on file  Transportation Needs: Not on file  Physical Activity: Not on file  Stress: Not on file  Social Connections: Not on file     Family History: The patient's family history includes Diabetes in her father; Stroke (age of onset: 62) in her mother. ROS:   Please see the history of present illness.    All 14 point review of systems negative except as described per history of present illness.  EKGs/Labs/Other Studies Reviewed:  The following studies were reviewed today:   EKG:  EKG Interpretation Date/Time:  Tuesday November 12 2022 08:02:24 EDT Ventricular Rate:  82 PR Interval:  120 QRS Duration:  86 QT Interval:  378 QTC Calculation: 441 R Axis:   69  Text Interpretation: Normal sinus rhythm Cannot rule out Anterior infarct , age undetermined Abnormal ECG No previous ECGs available Confirmed by Huntley Dec reddy [phone]) on 11/12/2022 8:15:07 AM    Recent Labs: No results found for requested labs within last 365 days.  Recent Lipid Panel    Component Value Date/Time   CHOL 262 (H) 07/04/2017 0957   TRIG 91 07/04/2017 0957   HDL 57 07/04/2017 0957   CHOLHDL 4.6 07/04/2017 0957   VLDL 41 (H) 02/14/2016 0923   LDLCALC 184 (H) 07/04/2017 0957    Physical Exam:    VS:  BP 120/78   Pulse 82   Ht 5\' 4"  (1.626 m)   Wt 120 lb 12.8 oz (54.8 kg)   SpO2 96%   BMI 20.74 kg/m     Wt Readings from Last 3 Encounters:  11/12/22 120 lb 12.8 oz (54.8 kg)  11/06/21 129 lb 12.8 oz (58.9 kg)  02/14/21 132 lb 12.8 oz (60.2 kg)     GENERAL:  Well nourished, well developed in no acute distress NECK: No JVD; No carotid bruits CARDIAC: RRR, S1 and S2 present, no murmurs, no rubs, no gallops CHEST:  Clear to auscultation without rales, wheezing or rhonchi  Extremities: No pitting pedal edema. Pulses bilaterally symmetric with radial 2+ and dorsalis pedis 2+ NEUROLOGIC:  Alert and oriented x 3  Medication  Adjustments/Labs and Tests Ordered: Current medicines are reviewed at length with the patient today.  Concerns regarding medicines are outlined above.  Orders Placed This Encounter  Procedures   CT CORONARY MORPH W/CTA COR W/SCORE W/CA W/CM &/OR WO/CM   Basic metabolic panel   EKG 12-Lead   ECHOCARDIOGRAM COMPLETE   Meds ordered this encounter  Medications   metoprolol tartrate (LOPRESSOR) 100 MG tablet    Sig: Take 1 tablet (100 mg total) by mouth once for 1 dose. Take 2 hours prior to your CT if your heart rate is greater than 55    Dispense:  1 tablet    Refill:  0   rosuvastatin (CRESTOR) 20 MG tablet    Sig: Take 1 tablet (20 mg total) by mouth daily.    Dispense:  90 tablet    Refill:  3   predniSONE (DELTASONE) 50 MG tablet    Sig: Prednisone 50 mg - take 13 hours prior to test Take another Prednisone 50 mg 7 hours prior to test Take another Prednisone 50 mg 1 hour prior to test Take Benadryl 50 mg 1 hour prior to test Patient must complete all four doses of above prophylactic medications. Patient will need a ride after test due to Benadryl.    Dispense:  3 tablet    Refill:  0    Signed, Ruthvik Barnaby reddy Gabrian Hoque, MD, MPH, [facility]. 11/12/2022 8:44 AM    [facility]

## 2022-11-12 NOTE — Patient Instructions (Addendum)
Medication Instructions:  Your physician has recommended you make the following change in your medication:   Start Rosuvastatin 20 mg daily.  [email or web address]  *If you need a refill on your cardiac medications before your next appointment, please call your pharmacy*   Lab Work: Your physician recommends that you have a BMP today in the office.   If you have labs (blood work) drawn today and your tests are completely normal, you will receive your results only by: MyChart Message (if you have MyChart) OR A paper copy in the mail If you have any lab test that is abnormal or we need to change your treatment, we will call you to review the results.   Testing/Procedures:   Your cardiac CT will be scheduled at one of the below locations:   [facility] [address] [place], [place] [address] [phone]  If scheduled at [facility], please arrive at the [facility] and Children's Entrance (Entrance C2) of [facility] 30 minutes prior to test start time. You can use the FREE valet parking offered at entrance C (encouraged to control the heart rate for the test)  Proceed to the [facility] Radiology Department (first floor) to check-in and test prep.  All radiology patients and guests should use entrance C2 at [facility], accessed from [facility], even though the hospital's physical address listed is [address]     Please follow these instructions carefully (unless otherwise directed): On the Night Before the Test: Be sure to Drink plenty of water. Do not consume any caffeinated/decaffeinated beverages or chocolate 12 hours prior to your test. Do not take any antihistamines 12 hours prior to your test. If the patient has contrast allergy: Patient will need a prescription for Prednisone and very clear instructions (as follows): Prednisone 50 mg - take 13 hours prior to test Take another Prednisone  50 mg 7 hours prior to test Take another Prednisone 50 mg 1 hour prior to test Take Benadryl 50 mg 1 hour prior to test Patient must complete all four doses of above prophylactic medications. Patient will need a ride after test due to Benadryl.  On the Day of the Test: Drink plenty of water until 1 hour prior to the test. Do not eat any food 1 hour prior to test. You may take your regular medications prior to the test.  Take metoprolol (Lopressor) two hours prior to test. FEMALES- please wear underwire-free bra if available, avoid dresses & tight clothing      After the Test: Drink plenty of water. After receiving IV contrast, you may experience a mild flushed feeling. This is normal. On occasion, you may experience a mild rash up to 24 hours after the test. This is not dangerous. If this occurs, you can take Benadryl 25 mg and increase your fluid intake. If you experience trouble breathing, this can be serious. If it is severe call 911 IMMEDIATELY. If it is mild, please call our office. If you take any of these medications: Glipizide/Metformin, Avandament, Glucavance, please do not take 48 hours after completing test unless otherwise instructed.  We will call to schedule your test 2-4 weeks out understanding that some insurance companies will need an authorization prior to the service being performed.   For non-scheduling related questions, please contact the cardiac imaging nurse navigator should you have any questions/concerns: Rockwell Alexandria, Cardiac Imaging Nurse Navigator Larey Brick, Cardiac Imaging Nurse Navigator [facility] Direct Office  Dial: [phone]   For scheduling needs, including cancellations and rescheduling, please call [place], [phone].   Your physician has requested that you have an echocardiogram. Echocardiography is a painless test that uses sound waves to create images of your heart. It provides your doctor with information  about the size and shape of your heart and how well your heart's chambers and valves are working. This procedure takes approximately one hour. There are no restrictions for this procedure. Please do NOT wear cologne, perfume, aftershave, or lotions (deodorant is allowed). Please arrive 15 minutes prior to your appointment time.  Your next appointment:   Based on results  The format for your next appointment:   In Person  Provider:   Vern Claude Madireddy, MD   Other Instructions Echocardiogram An echocardiogram is a test that uses sound waves to make images of your heart. This way of making images is often called ultrasound. The images from this test can help find out many things about your heart, including: The size and shape of your heart. The strength of your heart muscle and how well it's working. The size, thickness, and movement of your heart's walls. How your heart valves are working. Problems such as: A tumor or a growth from an infection around the heart valves. Areas of heart muscle that aren't working well because of poor blood flow or injury from a heart attack. An aneurysm. This is a weak or damaged part of an artery wall. An artery is a blood vessel. Tell a health care provider about: Any allergies you have. All medicines you're taking, including vitamins, herbs, eye drops, creams, and over-the-counter medicines. Any bleeding problems you have. Any surgeries you've had. Any medical problems you have. Whether you're pregnant or may be pregnant. What are the risks? Your health care provider will talk with you about risks. These may include an allergic reaction to IV dye that may be used during the test. What happens before the test? You don't need to do anything to get ready for this test. You may eat and drink normally. What happens during the test?  You'll take off your clothes from the waist up and put on a hospital gown. Sticky patches called electrodes may be  placed on your chest. These will be connected to a machine that monitors your heart rate and rhythm. You'll lie down on a table for the exam. A wand covered in gel will be moved over your chest. Sound waves from the wand will go to your heart and bounce back--or "echo" back. The sound waves will go to a computer that uses them to make images of your heart. The images can be viewed on a monitor. The images will also be recorded on the computer so your provider can look at them later. You may be asked to change positions or hold your breath for a short time. This makes it easier to get different views or better views of your heart. In some cases, you may be given a dye through an IV. The IV is put into one of your veins. This dye can make the areas of your heart easier to see. The procedure may vary among providers and hospitals. What can I expect after the test? You may return to your normal diet, activities, and medicines unless your provider tells you not to. If an IV was placed for the test, it will be removed. It's up to you to get the results of your test. Ask your provider, or the department  that's doing the test, when your results will be ready. This information is not intended to replace advice given to you by your health care provider. Make sure you discuss any questions you have with your health care provider. Document Revised: 03/29/2022 Document Reviewed: 03/29/2022 Elsevier Patient Education  2024 Elsevier Inc.   Cardiac CT Angiogram A cardiac CT angiogram is a procedure to look at the heart and the area around the heart. It may be done to help find the cause of chest pains or other symptoms of heart disease. During this procedure, a substance called contrast dye is injected into the blood vessels in the area to be checked. A large X-ray machine, called a CT scanner, then takes detailed pictures of the heart and the surrounding area. The procedure is also sometimes called a coronary CT  angiogram, coronary artery scanning, or CTA. A cardiac CT angiogram allows the health care provider to see how well blood is flowing to and from the heart. The health care provider will be able to see if there are any problems, such as: Blockage or narrowing of the coronary arteries in the heart. Fluid around the heart. Signs of weakness or disease in the muscles, valves, and tissues of the heart. Tell a health care provider about: Any allergies you have. This is especially important if you have had a previous allergic reaction to contrast dye. All medicines you are taking, including vitamins, herbs, eye drops, creams, and over-the-counter medicines. Any blood disorders you have. Any surgeries you have had. Any medical conditions you have. Whether you are pregnant or may be pregnant. Any anxiety disorders, chronic pain, or other conditions you have that may increase your stress or prevent you from lying still. What are the risks? Generally, this is a safe procedure. However, problems may occur, including: Bleeding. Infection. Allergic reactions to medicines or dyes. Damage to other structures or organs. Kidney damage from the contrast dye that is used. Increased risk of cancer from radiation exposure. This risk is low. Talk with your health care provider about: The risks and benefits of testing. How you can receive the lowest dose of radiation. What happens before the procedure? Wear comfortable clothing and remove any jewelry, glasses, dentures, and hearing aids. Follow instructions from your health care provider about eating and drinking. This may include: For 12 hours before the procedure -- avoid caffeine. This includes tea, coffee, soda, energy drinks, and diet pills. Drink plenty of water or other fluids that do not have caffeine in them. Being well hydrated can prevent complications. For 4-6 hours before the procedure -- stop eating and drinking. The contrast dye can cause nausea,  but this is less likely if your stomach is empty. Ask your health care provider about changing or stopping your regular medicines. This is especially important if you are taking diabetes medicines, blood thinners, or medicines to treat problems with erections (erectile dysfunction). What happens during the procedure?  Hair on your chest may need to be removed so that small sticky patches called electrodes can be placed on your chest. These will transmit information that helps to monitor your heart during the procedure. An IV will be inserted into one of your veins. You might be given a medicine to control your heart rate during the procedure. This will help to ensure that good images are obtained. You will be asked to lie on an exam table. This table will slide in and out of the CT machine during the procedure. Contrast dye will  be injected into the IV. You might feel warm, or you may get a metallic taste in your mouth. You will be given a medicine called nitroglycerin. This will relax or dilate the arteries in your heart. The table that you are lying on will move into the CT machine tunnel for the scan. The person running the machine will give you instructions while the scans are being done. You may be asked to: Keep your arms above your head. Hold your breath. Stay very still, even if the table is moving. When the scanning is complete, you will be moved out of the machine. The IV will be removed. The procedure may vary among health care providers and hospitals. What can I expect after the procedure? After your procedure, it is common to have: A metallic taste in your mouth from the contrast dye. A feeling of warmth. A headache from the nitroglycerin. Follow these instructions at home: Take over-the-counter and prescription medicines only as told by your health care provider. If you are told, drink enough fluid to keep your urine pale yellow. This will help to flush the contrast dye out of  your body. Most people can return to their normal activities right after the procedure. Ask your health care provider what activities are safe for you. It is up to you to get the results of your procedure. Ask your health care provider, or the department that is doing the procedure, when your results will be ready. Keep all follow-up visits as told by your health care provider. This is important. Contact a health care provider if: You have any symptoms of allergy to the contrast dye. These include: Shortness of breath. Rash or hives. A racing heartbeat. Summary A cardiac CT angiogram is a procedure to look at the heart and the area around the heart. It may be done to help find the cause of chest pains or other symptoms of heart disease. During this procedure, a large X-ray machine, called a CT scanner, takes detailed pictures of the heart and the surrounding area after a contrast dye has been injected into blood vessels in the area. Ask your health care provider about changing or stopping your regular medicines before the procedure. This is especially important if you are taking diabetes medicines, blood thinners, or medicines to treat erectile dysfunction. If you are told, drink enough fluid to keep your urine pale yellow. This will help to flush the contrast dye out of your body. This information is not intended to replace advice given to you by your health care provider. Make sure you discuss any questions you have with your health care provider. Document Revised: 09/23/2018 Document Reviewed: 09/23/2018 Elsevier Patient Education  2020 [employer].

## 2022-11-12 NOTE — Assessment & Plan Note (Signed)
Progressive more so over the past few months.  She does have COPD, seasonal allergies but symptoms seem to be well-regulated with albuterol use and daily Zyrtec.  Her symptom progression is different in comparison to prior seasons and over the short.. In the setting of her COPD this could be a pulmonary issue but the nature of progression of symptoms is more concerning for an anginal equivalent.  Continue with assessment for obstructive coronary artery disease and for any underlying structural heart disease with echocardiogram transthoracic and CT coronary angiogram.  If there is any obstructive coronary artery disease we will proceed with further antiplatelet therapy initiation with Plavix 75 mg once daily given her allergy reported to aspirin.

## 2022-11-13 LAB — BASIC METABOLIC PANEL
BUN/Creatinine Ratio: 16 (ref 12–28)
BUN: 12 mg/dL (ref 8–27)
CO2: 28 mmol/L (ref 20–29)
Calcium: 10.4 mg/dL — ABNORMAL HIGH (ref 8.7–10.3)
Chloride: 102 mmol/L (ref 96–106)
Creatinine, Ser: 0.75 mg/dL (ref 0.57–1.00)
Glucose: 93 mg/dL (ref 70–99)
Potassium: 4.3 mmol/L (ref 3.5–5.2)
Sodium: 140 mmol/L (ref 134–144)
eGFR: 91 mL/min/{1.73_m2} (ref >59)

## 2022-11-19 ENCOUNTER — Encounter (HOSPITAL_COMMUNITY): Payer: Self-pay

## 2022-11-22 ENCOUNTER — Other Ambulatory Visit: Payer: Self-pay | Admitting: Cardiology

## 2022-11-22 ENCOUNTER — Encounter (HOSPITAL_COMMUNITY): Payer: Self-pay

## 2022-11-22 ENCOUNTER — Ambulatory Visit (HOSPITAL_BASED_OUTPATIENT_CLINIC_OR_DEPARTMENT_OTHER)
Admission: RE | Admit: 2022-11-22 | Discharge: 2022-11-22 | Disposition: A | Discharge status: Home / Self Care | Payer: 59 | Source: Ambulatory Visit | Attending: Cardiology | Admitting: Cardiology

## 2022-11-22 ENCOUNTER — Ambulatory Visit (HOSPITAL_COMMUNITY)
Admission: RE | Admit: 2022-11-22 | Discharge: 2022-11-22 | Disposition: A | Discharge status: Home / Self Care | Payer: 59 | Source: Ambulatory Visit

## 2022-11-22 DIAGNOSIS — R931 Abnormal findings on diagnostic imaging of heart and coronary circulation: Secondary | ICD-10-CM | POA: Diagnosis not present

## 2022-11-22 DIAGNOSIS — I25118 Atherosclerotic heart disease of native coronary artery with other forms of angina pectoris: Secondary | ICD-10-CM | POA: Insufficient documentation

## 2022-11-22 MED ORDER — DIPHENHYDRAMINE HCL 50 MG/ML IJ SOLN
INTRAMUSCULAR | Status: AC
  Filled 2022-11-22: qty 1

## 2022-11-22 MED ORDER — NITROGLYCERIN 0.4 MG SL SUBL
0.8000 mg | SUBLINGUAL_TABLET | Freq: Once | SUBLINGUAL | Status: AC
  Administered 2022-11-22: 0.8 mg via SUBLINGUAL

## 2022-11-22 MED ORDER — IOHEXOL 350 MG/ML SOLN
95.0000 mL | Freq: Once | INTRAVENOUS | Status: AC | PRN
  Administered 2022-11-22: 95 mL via INTRAVENOUS

## 2022-11-22 MED ORDER — NITROGLYCERIN 0.4 MG SL SUBL
SUBLINGUAL_TABLET | SUBLINGUAL | Status: AC
  Filled 2022-11-22: qty 2

## 2022-11-25 ENCOUNTER — Other Ambulatory Visit: Payer: Self-pay

## 2022-11-25 MED ORDER — CLOPIDOGREL BISULFATE 75 MG PO TABS: 75.0000 mg | ORAL_TABLET | Freq: Every day | ORAL | 3 refills | Status: DC

## 2022-12-20 ENCOUNTER — Ambulatory Visit: Payer: 59

## 2022-12-20 DIAGNOSIS — I25118 Atherosclerotic heart disease of native coronary artery with other forms of angina pectoris: Secondary | ICD-10-CM | POA: Diagnosis not present

## 2022-12-21 LAB — ECHOCARDIOGRAM COMPLETE
Area-P 1/2: 4.8 cm2
S' Lateral: 2.2 cm

## 2023-04-01 ENCOUNTER — Ambulatory Visit (INDEPENDENT_AMBULATORY_CARE_PROVIDER_SITE_OTHER)
Admission: RE | Admit: 2023-04-01 | Discharge: 2023-04-01 | Disposition: A | Discharge status: Home / Self Care | Payer: 59 | Source: Ambulatory Visit | Attending: Family Medicine | Admitting: Family Medicine

## 2023-04-01 ENCOUNTER — Other Ambulatory Visit (HOSPITAL_BASED_OUTPATIENT_CLINIC_OR_DEPARTMENT_OTHER): Payer: Self-pay | Admitting: Family Medicine

## 2023-04-01 DIAGNOSIS — M25561 Pain in right knee: Secondary | ICD-10-CM

## 2023-06-02 ENCOUNTER — Ambulatory Visit

## 2023-06-02 VITALS — BP 132/68 | HR 92 | Ht 66.0 in | Wt 123.2 lb

## 2023-06-02 DIAGNOSIS — F17200 Nicotine dependence, unspecified, uncomplicated: Secondary | ICD-10-CM | POA: Diagnosis not present

## 2023-06-02 DIAGNOSIS — I2510 Atherosclerotic heart disease of native coronary artery without angina pectoris: Secondary | ICD-10-CM | POA: Diagnosis not present

## 2023-06-02 DIAGNOSIS — E782 Mixed hyperlipidemia: Secondary | ICD-10-CM

## 2023-06-02 NOTE — Assessment & Plan Note (Signed)
 She is working on quitting smoking.  In the past week she smoked 1 cigarette. She has been able to quit intermittently. She is motivated to quit and is working towards abstaining completely.  Encouraged her and congratulated on her ongoing attempts.

## 2023-06-02 NOTE — Progress Notes (Signed)
 Cardiology Consultation:    Date:  06/02/2023   ID:  Sierra Snyder, DOB August 27, 1960, MRN [id number]  PCP:  Sierra Beaver, MD  Cardiologist:  Sierra Kells, MD   Referring MD: Sierra Beaver, MD   No chief complaint on file.    ASSESSMENT AND PLAN:   Sierra Snyder 63 year old Coronary artery disease by cardiac CT imaging 11/22/2022 with calcium  score 411, moderate nonflow limiting CAD by CT FFR [50 to 69% stenosis of proximal RCA and OM1, 30 to 49% stenosis of LAD and LCx], normal biventricular function LVEF 60 to 65% on echocardiogram November 2024 without significant valve abnormalities, hyperlipidemia, only able to tolerate moderate dose of rosuvastatin  20 mg once daily unable to escalate further, COPD, smoking history.  Remote history of aspirin allergy, but started taking low-dose after the cardiac CT results and has been tolerating well.  Problem List Items Addressed This Visit     CAD (coronary artery disease) Ca Score 411, Mod RCA, OM1 50-69% no sig by CT FFR, Mild LAD and RCA 30-49% stenosis CT Cors 11/2022 - Primary   Good functional capacity. Continue with aspirin 81 mg once daily. Continue rosuvastatin  20 mg once daily.       Relevant Medications   aspirin 81 MG chewable tablet   Current smoker   She is working on quitting smoking.  In the past week she smoked 1 cigarette. She has been able to quit intermittently. She is motivated to quit and is working towards abstaining completely.  Encouraged her and congratulated on her ongoing attempts.      Mixed hyperlipidemia   Continue with rosuvastatin  20 mg once daily. Last lipid panel from September 2024 LDL 179, HDL 54, total cholesterol 102, non-HDL 205. She had started rosuvastatin  after these blood work results and office visit with us . She has follow-up visit with her PCP coming up on April 30 and is scheduled for fasting blood work including lipid panel.  Advised these results to be forwarded to our  office. If LDL not at goal below 70 mg/dL will recommend starting Zetia 10 mg once daily.       Relevant Medications   aspirin 81 MG chewable tablet      History of Present Illness:    Sierra Snyder is a 63 y.o. female who is being seen today for follow-up visit. PCP is Sistasis, Lindajo Resides, MD. Last office visit with me was 11-12-2022  Coronary artery disease by cardiac CT imaging 11/22/2022 with calcium  score 411, moderate nonflow limiting CAD by CT FFR [50 to 69% stenosis of proximal RCA and OM1, 30 to 49% stenosis of LAD and LCx], normal biventricular function LVEF 60 to 65% on echocardiogram November 2024 without significant valve abnormalities, hyperlipidemia, only able to tolerate moderate dose of rosuvastatin  20 mg once daily unable to escalate further, COPD, smoking history.  Remote history of aspirin allergy, but started taking low-dose after the cardiac CT results and has been tolerating well.  Here for the visit today by herself. Mentions she has been active keeps herself busy around the house or work and home the modification.  Walks her dog up to an hour every day.  Denies any ongoing cardiac symptoms at this time. Since the cardiac CT results are initially recommended Plavix  75 mg once daily, she preferred to try low-dose aspirin 81 mg and she has tolerated this well and has continued doing so.  Did not start Plavix .  Blood pressures have been well-controlled.  Past Medical History:  Diagnosis  Date   CAD (coronary artery disease) Ca Score 411, Mod RCA, OM1 50-69% no sig by CT FFR, Mild LAD and RCA 30-49% stenosis CT Cors 11/2022 05/05/2019   Community acquired pneumonia of right lung 04/15/2016   COPD (chronic obstructive pulmonary disease) (HCC)    Current smoker 11/06/2021   Dyspnea    occ with exertion   Dyspnea on exertion    occ with exertion     History of cardiovascular stress test 02/14/2016   normal per patient. 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  Navigational bronchoscopy on 02/06/2021 negative for malignant cells     Seasonal allergies    Stage 3 severe COPD by GOLD classification (HCC) 04/24/2016   Statin not tolerated    Tobacco use     Past Surgical History:  Procedure Laterality Date   ABDOMINAL HYSTERECTOMY     BRONCHIAL BIOPSY  02/06/2021   Procedure: BRONCHIAL BIOPSIES;  Surgeon: Prudy Brownie, DO;  Location: [facility];  Service: Pulmonary;;   BRONCHIAL BRUSHINGS  02/06/2021   Procedure: BRONCHIAL BRUSHINGS;  Surgeon: Prudy Brownie, DO;  Location: [facility];  Service: Pulmonary;;   BRONCHIAL WASHINGS  02/06/2021   Procedure: BRONCHIAL WASHINGS;  Surgeon: Prudy Brownie, DO;  Location: [facility];  Service: Pulmonary;;   BUNIONECTOMY Right 11/13/2021   HERNIA REPAIR     x 3   MULTIPLE TOOTH EXTRACTIONS     w/anesthesia   VIDEO BRONCHOSCOPY WITH RADIAL ENDOBRONCHIAL ULTRASOUND  02/06/2021   Procedure: RADIAL ENDOBRONCHIAL ULTRASOUND;  Surgeon: Prudy Brownie, DO;  Location: [facility];  Service: Pulmonary;;    Current Medications: Current Meds  Medication Sig   albuterol  (VENTOLIN  HFA) 108 (90 Base) MCG/ACT inhaler Inhale 1 puff into the lungs every 6 (six) hours as needed for wheezing or shortness of breath.   aspirin 81 MG chewable tablet Chew by mouth daily.   Cetirizine-Pseudoephedrine (ZYRTEC-D PO) Take 1 tablet by mouth daily.   gabapentin (NEURONTIN) 100 MG capsule Take 100 mg by mouth at bedtime as needed (pain).    levalbuterol (XOPENEX HFA) 45 MCG/ACT inhaler Inhale 2 puffs into the lungs every 6 (six) hours as needed.   nicotine (NICODERM CQ - DOSED IN MG/24 HR) 7 mg/24hr patch Place 7 mg onto the skin daily.   SPIRIVA RESPIMAT 1.25 MCG/ACT AERS Inhale 2 puffs into the lungs daily.     Allergies:   Allegra [fexofenadine], Aspirin, Atorvastatin , Cinnamon, Iodinated contrast media, Mucinex [guaifenesin er], Pravastatin, Rosuvastatin , Claritin [loratadine], and Tamiflu [oseltamivir phosphate]   Social History   Socioeconomic History   Marital status: Married    Spouse name: Not on file   Number of children: Not on file   Years of education: Not on file   Highest education level: Not on file  Occupational History   Not on file  Tobacco Use   Smoking status: Every Day    Current packs/day: 0.50    Average packs/day: 0.5 packs/day for 41.3 years (20.7  ttl pk-yrs)    Types: Cigarettes    Start date: 1984    Passive exposure: Past   Smokeless tobacco: Never   Tobacco comments:    Smokes about 1/2 pack per day 11/06/21 PAP  Vaping Use   Vaping status: Never Used  Substance and Sexual Activity   Alcohol use: No   Drug use: No   Sexual activity: Yes    Birth control/protection: Surgical    Comment: Hysterectomy  Other Topics Concern   Not on file  Social History Narrative   Not on file   Social Drivers of Health   Financial Resource Strain: Not on file  Food Insecurity: Not on file  Transportation Needs: Not on file  Physical Activity: Not on file  Stress: Not on file  Social Connections: Not on file     Family History: The patient's family history includes Diabetes in her father; Stroke (age of onset: 56) in her mother. ROS:   Please see the history of present illness.    All 14 point review of systems negative except as described per history of present illness.  EKGs/Labs/Other Studies Reviewed:    The following studies were reviewed today:   EKG:       Recent  Labs: 11/12/2022: BUN 12; Creatinine, Ser 0.75; Potassium 4.3; Sodium 140  Recent Lipid Panel    Component Value Date/Time   CHOL 262 (H) 07/04/2017 0957   TRIG 91 07/04/2017 0957   HDL 57 07/04/2017 0957   CHOLHDL 4.6 07/04/2017 0957   VLDL 41 (H) 02/14/2016 0923   LDLCALC 184 (H) 07/04/2017 0957    Physical Exam:    VS:  BP 132/68   Pulse 92   Ht 5\' 6"  (1.676 m)   Wt 123 lb 3.2 oz (55.9 kg)   SpO2 97%   BMI 19.89 kg/m     Wt Readings from Last 3 Encounters:  06/02/23 123 lb 3.2 oz (55.9 kg)  11/12/22 120 lb 12.8 oz (54.8 kg)  11/06/21 129 lb 12.8 oz (58.9 kg)     GENERAL:  Well nourished, well developed in no acute distress NECK: No JVD; No carotid bruits CARDIAC: RRR, S1 and S2 present, no murmurs, no rubs, no gallops CHEST:  Clear to auscultation without rales, wheezing or rhonchi  Extremities: No pitting pedal edema. Pulses bilaterally symmetric with radial 2+ and dorsalis pedis 2+ NEUROLOGIC:  Alert and oriented x 3  Medication Adjustments/Labs and Tests Ordered: Current medicines are reviewed at length with the patient today.  Concerns regarding medicines are outlined above.  No orders of the defined types were placed in this encounter.  No orders of the defined types were placed in this encounter.   Signed, Lura Sallies, MD, MPH, [facility]. 06/02/2023 1:37 PM    [facility]

## 2023-06-02 NOTE — Patient Instructions (Signed)
 Medication Instructions:  Your physician recommends that you continue on your current medications as directed. Please refer to the Current Medication list given to you today.  *If you need a refill on your cardiac medications before your next appointment, please call your pharmacy*   Lab Work: None Ordered If you have labs (blood work) drawn today and your tests are completely normal, you will receive your results only by: MyChart Message (if you have MyChart) OR A paper copy in the mail If you have any lab test that is abnormal or we need to change your treatment, we will call you to review the results.   Testing/Procedures: None Ordered   Follow-Up: At [facility], you and your health needs are our priority.  As part of our continuing mission to provide you with exceptional heart care, we have created designated Provider Care Teams.  These Care Teams include your primary Cardiologist (physician) and Advanced Practice Providers (APPs -  Physician Assistants and Nurse Practitioners) who all work together to provide you with the care you need, when you need it.  We recommend signing up for the patient portal called "MyChart".  Sign up information is provided on this After Visit Summary.  MyChart is used to connect with patients for Virtual Visits (Telemedicine).  Patients are able to view lab/test results, encounter notes, upcoming appointments, etc.  Non-urgent messages can be sent to your provider as well.   To learn more about what you can do with MyChart, go to [email or web address].    Your next appointment:   1 year

## 2023-06-02 NOTE — Assessment & Plan Note (Signed)
 Good functional capacity. Continue with aspirin 81 mg once daily. Continue rosuvastatin  20 mg once daily.

## 2023-06-02 NOTE — Assessment & Plan Note (Signed)
 Continue with rosuvastatin  20 mg once daily. Last lipid panel from September 2024 LDL 179, HDL 54, total cholesterol 161, non-HDL 205. She had started rosuvastatin  after these blood work results and office visit with us . She has follow-up visit with her PCP coming up on April 30 and is scheduled for fasting blood work including lipid panel.  Advised these results to be forwarded to our office. If LDL not at goal below 70 mg/dL will recommend starting Zetia 10 mg once daily.

## 2023-06-22 ENCOUNTER — Ambulatory Visit (HOSPITAL_BASED_OUTPATIENT_CLINIC_OR_DEPARTMENT_OTHER)
Admit: 2023-06-22 | Discharge: 2023-06-22 | Disposition: A | Discharge status: Home / Self Care | Attending: Family Medicine | Admitting: Radiology

## 2023-06-22 ENCOUNTER — Ambulatory Visit (HOSPITAL_BASED_OUTPATIENT_CLINIC_OR_DEPARTMENT_OTHER)
Admission: EM | Admit: 2023-06-22 | Discharge: 2023-06-22 | Disposition: A | Discharge status: Home / Self Care | Attending: Family Medicine | Admitting: Family Medicine

## 2023-06-22 ENCOUNTER — Encounter (HOSPITAL_BASED_OUTPATIENT_CLINIC_OR_DEPARTMENT_OTHER): Payer: Self-pay

## 2023-06-22 DIAGNOSIS — M25551 Pain in right hip: Secondary | ICD-10-CM

## 2023-06-22 DIAGNOSIS — W19XXXA Unspecified fall, initial encounter: Secondary | ICD-10-CM | POA: Diagnosis not present

## 2023-06-22 NOTE — ED Notes (Signed)
 Patient will need transported to hospital due to right hip fx. EMS called.

## 2023-06-22 NOTE — ED Provider Notes (Signed)
 [facility]    CSN: [id number] Arrival date & time: 06/22/23  0956      History   Chief Complaint Chief Complaint  Patient presents with   Hip Pain    HPI Sierra Snyder is a 63 y.o. female.     63 year old female presents today with right hip pain.  She had a fall prior to arrival and landed on some concrete.  Since she has been unable to bear weight on the right leg and had a significant amount of pain.  She is having weakness in the leg.  Denies any numbness, tingling.    Hip Pain    Past Medical History:  Diagnosis Date   CAD (coronary artery disease) Ca Score 411, Mod RCA, OM1 50-69% no sig by CT FFR, Mild LAD and RCA 30-49% stenosis CT Cors 11/2022 05/05/2019   Community acquired pneumonia of right lung 04/15/2016   COPD (chronic obstructive pulmonary disease) (HCC)    Current smoker 11/06/2021   Dyspnea    occ with exertion   Dyspnea on exertion    occ with exertion     History of cardiovascular stress test 02/14/2016   normal per patient. 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  Navigational bronchoscopy on 02/06/2021 negative for malignant cells     Seasonal allergies    Stage 3 severe COPD by GOLD classification (HCC) 04/24/2016   Statin not tolerated    Tobacco use     Patient Active Problem List   Diagnosis Date Noted   COPD (chronic obstructive pulmonary disease) (HCC)    Dyspnea on exertion    HLD (hyperlipidemia)    Lung nodule    Mixed hyperlipidemia     Statin not tolerated    Tobacco use    Current smoker 11/06/2021   Right upper lobe pulmonary nodule 01/26/2021   CAD (coronary artery disease) Ca Score 411, Mod RCA, OM1 50-69% no sig by CT FFR, Mild LAD and RCA 30-49% stenosis CT Cors 11/2022 05/05/2019   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   Stage 3 severe COPD by GOLD classification (HCC) 04/24/2016   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/15/2016   Community acquired pneumonia of right lung 04/15/2016   Notalgia 04/15/2016   IBS (irritable bowel syndrome) 02/14/2016   Seasonal allergies 02/14/2016   Postmenopausal atrophic vaginitis 02/14/2016   History of cardiovascular stress test 02/14/2016    Past Surgical History:  Procedure Laterality Date   ABDOMINAL HYSTERECTOMY     BRONCHIAL BIOPSY  02/06/2021   Procedure: BRONCHIAL BIOPSIES;  Surgeon: Prudy Brownie, DO;  Location: [facility];  Service: Pulmonary;;   BRONCHIAL BRUSHINGS  02/06/2021   Procedure: BRONCHIAL BRUSHINGS;  Surgeon: Prudy Brownie, DO;  Location: [facility];  Service: Pulmonary;;   BRONCHIAL WASHINGS  02/06/2021   Procedure: BRONCHIAL WASHINGS;  Surgeon: Prudy Brownie, DO;  Location: [facility];  Service: Pulmonary;;   BUNIONECTOMY Right 11/13/2021   HERNIA REPAIR     x 3   MULTIPLE TOOTH  EXTRACTIONS     w/anesthesia   VIDEO BRONCHOSCOPY WITH RADIAL ENDOBRONCHIAL ULTRASOUND  02/06/2021   Procedure: RADIAL ENDOBRONCHIAL ULTRASOUND;  Surgeon: Prudy Brownie, DO;  Location: [facility];  Service: Pulmonary;;    OB History   No obstetric history on file.      Home Medications    Prior to Admission medications   Medication Sig Start Date End Date Taking? Authorizing Provider  albuterol  (VENTOLIN  HFA) 108 (90 Base) MCG/ACT inhaler Inhale 1 puff into the lungs every 6 (six) hours as needed for wheezing or shortness of breath. 11/06/21   Antonio Baumgarten, NP  aspirin 81 MG chewable tablet Chew by mouth daily.    [provider]   Cetirizine-Pseudoephedrine (ZYRTEC-D PO) Take 1 tablet by mouth daily.    [provider]  gabapentin (NEURONTIN) 100 MG capsule Take 100 mg by mouth at bedtime as needed (pain). 05/13/22   [provider]  levalbuterol Bernhard Brine HFA) 45 MCG/ACT inhaler Inhale 2 puffs into the lungs every 6 (six) hours as needed. 12/23/22   [provider]  nicotine (NICODERM CQ - DOSED IN MG/24 HR) 7 mg/24hr patch Place 7 mg onto the skin daily. 12/18/22   [provider]  rosuvastatin  (CRESTOR ) 20 MG tablet Take 1 tablet (20 mg total) by mouth daily. 11/12/22 02/10/23  Madireddy, Daymon Evans, MD  SPIRIVA RESPIMAT 1.25 MCG/ACT AERS Inhale 2 puffs into the lungs daily. 09/18/22   [provider]    Family History Family History  Problem Relation Age of Onset   Stroke Mother 25   Diabetes Father     Social History Social History   Tobacco Use   Smoking status: Every Day    Current packs/day: 0.50    Average packs/day: 0.5 packs/day for 41.4 years (20.7 ttl pk-yrs)    Types: Cigarettes    Start date: 1984    Passive exposure: Past   Smokeless tobacco: Never   Tobacco comments:    Smokes about 1/2 pack per day 11/06/21 PAP  Vaping Use   Vaping status: Never Used  Substance Use Topics   Alcohol use: No   Drug use: No     Allergies   Allegra [fexofenadine], Aspirin, Atorvastatin , Cinnamon, Iodinated contrast media, Mucinex [guaifenesin er], Pravastatin, Rosuvastatin , Claritin [loratadine], and Tamiflu [oseltamivir phosphate]   Review of Systems Review of Systems  See HPI Physical Exam Triage Vital Signs ED Triage Vitals  Encounter Vitals Group     BP 06/22/23 1040 (!) 140/84     Systolic BP Percentile --      Diastolic BP Percentile --      Pulse Rate 06/22/23 1040 (!) 103     Resp 06/22/23 1040 20     Temp 06/22/23 1040 97.8 F (36.6 C)     Temp Source 06/22/23 1040 Oral     SpO2 06/22/23 1040 96 %     Weight --      Height --      Head  Circumference --      Peak Flow --      Pain Score 06/22/23 1042 8     Pain Loc --      Pain Education --      Exclude from Growth Chart --    No data found.  Updated Vital Signs BP (!) 140/84 (BP Location: Left Arm)   Pulse (!) 103   Temp 97.8 F (36.6 C) (Oral)   Resp 20   SpO2 96%   Visual  Acuity Right Eye Distance:   Left Eye Distance:   Bilateral Distance:    Right Eye Near:   Left Eye Near:    Bilateral Near:     Physical Exam Vitals and nursing note reviewed.  Constitutional:      General: She is in acute distress.  Musculoskeletal:        General: Tenderness and signs of injury present.     Comments: Pt very uncomfortable and unable to perform ROM exercises.   Neurological:     Mental Status: She is alert.      UC Treatments / Results  Labs (all labs ordered are listed, but only abnormal results are displayed) Labs Reviewed - No data to display  EKG   Radiology DG Hip Unilat With Pelvis 2-3 Views Right Result Date: 06/22/2023 CLINICAL DATA:  Marvell Slider.  Right hip pain. EXAM: DG HIP (WITH OR WITHOUT PELVIS) 2-3V RIGHT COMPARISON:  None Available. FINDINGS: There is a displaced basicervical neck fracture of the right hip. The left hip is intact. The pubic symphysis and SI joints are intact.  No pelvic fractures. IMPRESSION: Displaced basicervical neck fracture of the right hip. Electronically Signed   By: Marrian Siva M.D.   On: 06/22/2023 11:29    Procedures Procedures (including critical care time)  Medications Ordered in UC Medications - No data to display  Initial Impression / Assessment and Plan / UC Course  I have reviewed the triage vital signs and the nursing notes.  Pertinent labs & imaging results that were available during my care of the patient were reviewed by me and considered in my medical decision making (see chart for details).     Right hip pain-x-ray positive for displaced basicervical neck fracture of the right hip as  suspected. Based on amount of pain and unstable fracture will have her transported by EMS to the hospital for further management of this fracture.  Final Clinical Impressions(s) / UC Diagnoses   Final diagnoses:  Pain of right hip   Discharge Instructions      Sending to the ER for unstable hip fracture.   ED Prescriptions   None    PDMP not reviewed this encounter.   Landa Pine, FNP 06/22/23 1217

## 2023-06-22 NOTE — ED Notes (Signed)
 Patient is being discharged from the Urgent Care and sent to the Emergency Department via EMS . Per Jerri Morale, FNP, patient is in need of higher level of care due to hip fracture. Patient is aware and verbalizes understanding of plan of care.  Vitals:   06/22/23 1040  BP: (!) 140/84  Pulse: (!) 103  Resp: 20  Temp: 97.8 F (36.6 C)  SpO2: 96%

## 2023-06-22 NOTE — Discharge Instructions (Signed)
 Sending to the ER for unstable hip fracture.

## 2023-06-22 NOTE — ED Notes (Signed)
 Called [facility] to advise ER that patient will be transported to their facility.

## 2023-06-22 NOTE — ED Notes (Signed)
 EMS arrival. Care transferred. Documents provided to ambulance crew.

## 2023-06-22 NOTE — ED Triage Notes (Signed)
 Slip and fall onto concrete. Landed on right hip. Unable to bear weight on right leg.

## 2023-09-29 ENCOUNTER — Other Ambulatory Visit: Payer: Self-pay | Admitting: Acute Care

## 2023-09-29 DIAGNOSIS — Z87891 Personal history of nicotine dependence: Secondary | ICD-10-CM

## 2023-09-29 DIAGNOSIS — F17210 Nicotine dependence, cigarettes, uncomplicated: Secondary | ICD-10-CM

## 2023-09-29 DIAGNOSIS — Z122 Encounter for screening for malignant neoplasm of respiratory organs: Secondary | ICD-10-CM

## 2023-10-24 ENCOUNTER — Ambulatory Visit
Admission: RE | Admit: 2023-10-24 | Discharge: 2023-10-24 | Disposition: A | Discharge status: Home / Self Care | Source: Ambulatory Visit | Attending: Acute Care | Admitting: Acute Care

## 2023-10-24 DIAGNOSIS — Z122 Encounter for screening for malignant neoplasm of respiratory organs: Secondary | ICD-10-CM

## 2023-10-24 DIAGNOSIS — Z87891 Personal history of nicotine dependence: Secondary | ICD-10-CM

## 2023-10-24 DIAGNOSIS — F17210 Nicotine dependence, cigarettes, uncomplicated: Secondary | ICD-10-CM

## 2023-11-05 ENCOUNTER — Other Ambulatory Visit: Payer: Self-pay | Admitting: Acute Care

## 2023-11-05 DIAGNOSIS — Z122 Encounter for screening for malignant neoplasm of respiratory organs: Secondary | ICD-10-CM

## 2023-11-05 DIAGNOSIS — Z87891 Personal history of nicotine dependence: Secondary | ICD-10-CM

## 2023-11-05 DIAGNOSIS — F17210 Nicotine dependence, cigarettes, uncomplicated: Secondary | ICD-10-CM

## 2023-12-05 ENCOUNTER — Other Ambulatory Visit: Payer: Self-pay

## 2023-12-05 DIAGNOSIS — E782 Mixed hyperlipidemia: Secondary | ICD-10-CM
